# Patient Record
Sex: Male | Born: 1950 | Race: White | Hispanic: No | Smoking: Former smoker
Health system: Southern US, Community
[De-identification: ages and names within clinical notes are randomized; demographics above are authoritative.]

## PROBLEM LIST (undated history)

## (undated) DIAGNOSIS — I10 Essential (primary) hypertension: Secondary | ICD-10-CM

## (undated) DIAGNOSIS — J302 Other seasonal allergic rhinitis: Secondary | ICD-10-CM

## (undated) DIAGNOSIS — L0390 Cellulitis, unspecified: Secondary | ICD-10-CM

## (undated) DIAGNOSIS — E7800 Pure hypercholesterolemia, unspecified: Secondary | ICD-10-CM

## (undated) DIAGNOSIS — G4730 Sleep apnea, unspecified: Secondary | ICD-10-CM

## (undated) HISTORY — DX: Other seasonal allergic rhinitis: J30.2

## (undated) HISTORY — DX: Sleep apnea, unspecified: G47.30

## (undated) HISTORY — DX: Pure hypercholesterolemia, unspecified: E78.00

## (undated) HISTORY — PX: OTHER SURGICAL HISTORY: SHX169

## (undated) HISTORY — DX: Essential (primary) hypertension: I10

## (undated) HISTORY — PX: CATARACT EXTRACTION: SUR2

---

## 1989-08-22 ENCOUNTER — Encounter: Payer: Self-pay | Admitting: Internal Medicine

## 1989-09-13 ENCOUNTER — Encounter: Payer: Self-pay | Admitting: Internal Medicine

## 2006-03-16 ENCOUNTER — Ambulatory Visit: Payer: Self-pay | Admitting: Internal Medicine

## 2007-03-15 DIAGNOSIS — E119 Type 2 diabetes mellitus without complications: Secondary | ICD-10-CM

## 2007-03-15 DIAGNOSIS — G4733 Obstructive sleep apnea (adult) (pediatric): Secondary | ICD-10-CM

## 2007-03-15 DIAGNOSIS — E7800 Pure hypercholesterolemia, unspecified: Secondary | ICD-10-CM

## 2007-03-15 DIAGNOSIS — J301 Allergic rhinitis due to pollen: Secondary | ICD-10-CM

## 2007-03-15 DIAGNOSIS — IMO0002 Reserved for concepts with insufficient information to code with codable children: Secondary | ICD-10-CM | POA: Insufficient documentation

## 2007-03-17 ENCOUNTER — Encounter: Payer: Self-pay | Admitting: Internal Medicine

## 2008-05-01 ENCOUNTER — Ambulatory Visit: Payer: Self-pay | Admitting: Internal Medicine

## 2009-07-21 ENCOUNTER — Ambulatory Visit: Payer: Self-pay | Admitting: Internal Medicine

## 2010-03-12 NOTE — Assessment & Plan Note (Signed)
Summary: rov/apc   Primary Provider/Referring Provider:  Gerri Spore  CC:  Follow up visit-sleep apnea.  History of Present Illness: He comes now to establish here for long-term followup.  He is using a nasal mask through [employer].  He gets occasional mild nasal congestion and treats himself with nasal saline spray and Breathe Right nasal strips, which work well.  He does drink some coffee, but does not push caffeine.  He has been able to lose about 20 pounds in the last couple of years.   May 14, 2008- OSA, allergic rhinitis Seasonal use now of Neti pot, Zyrtec. Continues cpap, good compliance at 10 cwp. 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 does drink occasional caffeine. Using nasal mask, [facility]. He smoked over 30 years. Dr Gerri Spore follows cxr.  July 21, 2009 OSA. allergic rhintis  He asks about changing DME company for sleep apnea  for problems with service. CPAP at 10 remains comfortable and he never sleeps without it. Current machine is getting old.   Hypertension History:      Positive major cardiovascular risk factors include male age 18 years old or older, diabetes, and hyperlipidemia.  Negative major cardiovascular risk factors include non-tobacco-user status.     Preventive Screening-Counseling & Management  Alcohol-Tobacco     Smoking Status: quit > 6 months  Current Medications (verified): 1)  Cpap 10 [facility] 2)  Metformin Hcl 1000 Mg  Tabs (Metformin Hcl) .... Take 1 Tablet By Mouth Once A Day 3)  Lisinopril 20 Mg  Tabs (Lisinopril) .... 1/2 Tab Daily 4)  Simvastatin 40 Mg  Tabs (Simvastatin) .... Take 1 Tablet By Mouth Once A Day 5)  Januvia 50 Mg Tabs (Sitagliptin Phosphate) .... Take 1 By Mouth Once Daily 6)  Glyburide 2.5 Mg Tabs (Glyburide) .... Take 1 By Mouth Once Daily 7)  Avodart 0.5 Mg Caps (Dutasteride) .... Take 1 By Mouth Once Daily 8)  Fluticasone Propionate 50 Mcg/act Susp (Fluticasone Propionate)  .Marland Kitchen.. 1-2 Sprays in Each Nostril Once Daily As Needed 9)  Zyrtec Hives Relief 10 Mg Tabs (Cetirizine Hcl) .... Take 1 By Mouth Once Daily  Allergies (verified): No Known Drug Allergies  Past History:  Past Surgical History: Last updated: May 14, 2008 Repair septal deviation  Family History: Last updated: 05/14/2008 Father died- cancer  Social History: Last updated: 07/21/2009 Patient states former smoker.  Married Naval architect- long haul  Risk Factors: Smoking Status: quit > 6 months (07/21/2009)  Past Medical History: SLEEP APNEA (ICD-780.57)                         > NPSG 08/22/89  RDI 33/hr HYPERCHOLESTEROLEMIA (ICD-272.0) ALLERGIC RHINITIS, SEASONAL (ICD-477.0) DIABETES MELLITUS (ICD-250.00) HYPERTENSION NEC (ICD-997.91)  Social History: Patient states former smoker.  Married Naval architect- long haul Smoking Status:  quit > 6 months  Review of Systems      See HPI  The patient denies shortness of breath with activity, shortness of breath at rest, productive cough, non-productive cough, coughing up blood, chest pain, irregular heartbeats, acid heartburn, indigestion, loss of appetite, weight change, abdominal pain, difficulty swallowing, sore throat, tooth/dental problems, headaches, nasal congestion/difficulty breathing through nose, and sneezing.    Vital Signs:  Patient profile:   60 year old male Height:      73 inches Weight:      225 pounds BMI:     29.79 O2 Sat:      95 % on Room  air Pulse rate:   85 / minute BP sitting:   134 / 80  (left arm) Cuff size:   regular  Vitals Entered By: Reynaldo Minium CMA (July 21, 2009 2:37 PM)  O2 Flow:  Room air  Physical Exam  Additional Exam:  General: A/Ox3; pleasant and cooperative, NAD, alert SKIN: pimple on bridge of nose he says is incidental, not affected by his CPAP mask NODES: no lymphadenopathy HEENT: [place]/AT, EOM- WNL, Conjuctivae- clear, PERRLA, TM-WNL, Nose- clear, Throat- clear and wnl, Mallampati  II-III NECK: Supple w/ fair ROM, JVD- none, normal carotid impulses w/o bruits Thyroid-  CHEST: Clear to P&A, diminished HEART: RRR, no m/g/r heard ABDOMEN:  EAV:WUJW, nl pulses, no edema  NEURO: Grossly intact to observation      Impression & Recommendations:  Problem # 1:  SLEEP APNEA (ICD-780.57)  We will get old chart and also record from [facility]. His old machine has failed. We will replace it and let him talk to [facility] aboput changing DME companies. Original sleep study records found and reviewed.  Problem # 2:  ALLERGIC RHINITIS, SEASONAL (ICD-477.0) Discussed. he doesn't recognize much nasal congestion now, or interference with CPAP.  Medications Added to Medication List This Visit: 1)  Januvia 50 Mg Tabs (Sitagliptin phosphate) .... Take 1 by mouth once daily 2)  Glyburide 2.5 Mg Tabs (Glyburide) .... Take 1 by mouth once daily 3)  Avodart 0.5 Mg Caps (Dutasteride) .... Take 1 by mouth once daily 4)  Fluticasone Propionate 50 Mcg/act Susp (Fluticasone propionate) .Marland Kitchen.. 1-2 sprays in each nostril once daily as needed 5)  Zyrtec Hives Relief 10 Mg Tabs (Cetirizine hcl) .... Take 1 by mouth once daily  Other Orders: Est. Patient Level III (11914) DME Referral (DME)  Hypertension Assessment/Plan:      The patient's hypertensive risk group is category C: Target organ damage and/or diabetes.  Today's blood pressure is 134/80.    Patient Instructions: 1)  Please schedule a follow-up appointment in 1 year. 2)  See [facility] to replace CPAP machine and also talk with her about changing home care companies.

## 2010-06-26 NOTE — Assessment & Plan Note (Signed)
[facility]                             PULMONARY OFFICE NOTE   NAME:Adam Cardenas, Adam Cardenas                        MRN:          [id number]  DATE:03/16/2006                            DOB:          12-Aug-1950    PULMONARY/SLEEP MEDICINE FOLLOWUP:  Obstructive sleep apnea.   HISTORY:  This gentleman was diagnosed with obstructive sleep apnea  years ago through [facility].  I do  not find the original report, but he has used C-PAP successfully for  many years at 10 CWP and uses it all night every night.  His wife does  not tell him he snores through it.  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  He  comes now to establish here for long-term followup.  He is using a nasal  mask through [employer].  He gets occasional mild nasal  congestion and treats himself with nasal saline spray and Breathe Right  nasal strips, which work well.  He does drink some coffee, but does not  push caffeine.  He has been able to lose about 20 pounds in the last  couple of years.   MEDICATIONS:  1. C-PAP at 10 CWP.  2. Metformin at 1000 mg.  3. Lisinopril 20 mg times one half.  4. Simvastatin 40 mg.   ALLERGIES:  No medication allergy.   REVIEW OF SYSTEMS:  No headaches, syncope, confusion, sleep disordered  movement or acute events.  Weight down, as noted.   PAST HISTORY:  Septoplasty around 21.  Hypertension, diabetes,  elevated cholesterol, sleep apnea.  No significant seasonal allergy  complaints.   SOCIAL HISTORY:  He quit smoking in 2001, after averaging a pack a day  for 35 years.  Married, working as a Naval architect.   FAMILY HISTORY:  Father had metastatic cancer.  Others in the family  with heart disease.   OBJECTIVE:  Weight 201 pounds, compared with 203 pounds in 1991.  BP  124/68, pulse regular at 65, room air saturation 96%.  This is a tall  man.  He  is not overweight.  Nasal airway is clear.  Palate spacing 2/4,  voice quality is normal, mandible normal, pharyngeal spacing 2/4 with no  stridor.  Lungs are clear.  No neck vein distention or thyromegaly.  Heart sounds regular, without murmur.  No restlessness or tremor.   IMPRESSION:  Stable control of obstructive sleep apnea.  Irregular sleep  schedule because of his job.   PLAN:  1. We discussed the physiology and available treatments for sleep      apnea, emphasizing his responsibility to be a safe and alert      driver.  2. Continue C-PAP with 10 CWP through [facility].  3. Schedule return in one year, earlier p.r.n.     Clinton D. Maple Hudson, MD, Tonny Bollman, FACP  Electronically Signed    CDY/MedQ  DD: 03/16/2006  DT: 03/16/2006  Job #: [id number]   cc:   Otilio Connors. Gerri Spore, M.D.

## 2011-01-01 ENCOUNTER — Encounter: Payer: Self-pay | Admitting: Internal Medicine

## 2011-01-04 ENCOUNTER — Ambulatory Visit: Payer: Self-pay | Admitting: Internal Medicine

## 2012-05-29 ENCOUNTER — Encounter: Payer: Self-pay | Admitting: Internal Medicine

## 2012-05-29 ENCOUNTER — Ambulatory Visit (INDEPENDENT_AMBULATORY_CARE_PROVIDER_SITE_OTHER): Payer: BC Managed Care – PPO | Admitting: Internal Medicine

## 2012-05-29 ENCOUNTER — Ambulatory Visit (INDEPENDENT_AMBULATORY_CARE_PROVIDER_SITE_OTHER)
Admission: RE | Admit: 2012-05-29 | Discharge: 2012-05-29 | Disposition: A | Discharge status: Home / Self Care | Payer: BC Managed Care – PPO | Source: Ambulatory Visit | Attending: Internal Medicine | Admitting: Internal Medicine

## 2012-05-29 VITALS — BP 120/78 | HR 72 | Ht 73.0 in | Wt 218.4 lb

## 2012-05-29 DIAGNOSIS — J309 Allergic rhinitis, unspecified: Secondary | ICD-10-CM

## 2012-05-29 DIAGNOSIS — Z87891 Personal history of nicotine dependence: Secondary | ICD-10-CM

## 2012-05-29 DIAGNOSIS — G4733 Obstructive sleep apnea (adult) (pediatric): Secondary | ICD-10-CM

## 2012-05-29 DIAGNOSIS — J302 Other seasonal allergic rhinitis: Secondary | ICD-10-CM

## 2012-05-29 DIAGNOSIS — J301 Allergic rhinitis due to pollen: Secondary | ICD-10-CM

## 2012-05-29 NOTE — Patient Instructions (Addendum)
We can continue CPAP 10/ [facility]. Please call as needed  Order-- CXR   Dx hx tobacco use

## 2012-05-29 NOTE — Progress Notes (Signed)
05/29/12- 68 yoM former smoker followed for OSA  FOLLOWS NWG:NFAOZH visit to show compliance for insurance as being followed for OSA; Wears CPAP every night for about 7 hours(never takes off during sleep). LOV 07/21/09. 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 CPAP 10/[facility] with nasal mask. He prefers not to use humidifier.no changes needed. Seasonal allergic rhinitis with occasional stuffiness. He rinses with saline and uses Flonase. Not coughing.  ROS-see HPI Constitutional:   No-   weight loss, night sweats, fevers, chills, fatigue, lassitude. HEENT:   No-  headaches, difficulty swallowing, tooth/dental problems, sore throat,       No-  sneezing, itching, ear ache, nasal congestion, post nasal drip,  CV:  No-   chest pain, orthopnea, PND, swelling in lower extremities, anasarca,                                  dizziness, palpitations Resp: No-   shortness of breath with exertion or at rest.              No-   productive cough,  No non-productive cough,  No- coughing up of blood.              No-   change in color of mucus.  No- wheezing.   Skin: No-   rash or lesions. GI:  No-   heartburn, indigestion, abdominal pain, nausea, vomiting, diarrhea,                 change in bowel habits, loss of appetite GU: No-   dysuria, change in color of urine, no urgency or frequency.  No- flank pain. MS:  No-   joint pain or swelling.  No- decreased range of motion.  No- back pain. Neuro-     nothing unusual Psych:  No- change in mood or affect. No depression or anxiety.  No memory loss.  OBJ- Physical Exam General- Alert, Oriented, Affect-appropriate, Distress- none acute. Medium build Skin- rash-none, lesions- none, excoriation- none Lymphadenopathy- none Head- atraumatic            Eyes- Gross vision intact, PERRLA, conjunctivae and secretions clear            Ears- Hearing, canals-normal            Nose- Clear, no-Septal dev, mucus, polyps, erosion, perforation             Throat-  Mallampati III , mucosa clear , drainage- none, tonsils- atrophic Neck- flexible , trachea midline, no stridor , thyroid nl, carotid no bruit Chest - symmetrical excursion , unlabored           Heart/CV- RRR , no murmur , no gallop  , no rub, nl s1 s2                           - JVD- none , edema- none, stasis changes- none, varices- none           Lung- clear to P&A, wheeze- none, cough- none , dullness-none, rub- none           Chest wall-  Abd-  Br/ Gen/ Rectal- Not done, not indicated Extrem- cyanosis- none, clubbing, none, atrophy- none, strength- nl Neuro- grossly intact to observation

## 2012-06-02 ENCOUNTER — Encounter: Payer: Self-pay | Admitting: *Deleted

## 2012-06-02 NOTE — Progress Notes (Signed)
Quick Note:  ATC patient at [facility] number-wrong number-no other numbers listed for patient . Will send letter to patient to have him call and get CXR results as well as give [place] a good, working phone number to contact him on. ______

## 2012-06-04 DIAGNOSIS — J302 Other seasonal allergic rhinitis: Secondary | ICD-10-CM | POA: Insufficient documentation

## 2012-06-04 NOTE — Assessment & Plan Note (Addendum)
Good compliance and control. He is doing very well with this. Former smoker so I am checking chest x-ray for documentation.

## 2012-06-04 NOTE — Assessment & Plan Note (Signed)
Adequate control with Flonase, occasional antihistamine and saline nasal rinse.

## 2012-06-12 ENCOUNTER — Telehealth: Payer: Self-pay | Admitting: Internal Medicine

## 2012-06-12 NOTE — Telephone Encounter (Signed)
Notes Recorded by Waymon Budge, MD on 05/29/2012 at 3:19 PM CXR- scar or crowded markings in left base. No active process seen  I spoke with patient about results and he verbalized understanding and had no questions

## 2013-05-28 ENCOUNTER — Ambulatory Visit: Payer: BC Managed Care – PPO | Admitting: Internal Medicine

## 2013-07-27 ENCOUNTER — Other Ambulatory Visit (HOSPITAL_COMMUNITY): Payer: Self-pay | Admitting: Family Medicine

## 2013-07-27 DIAGNOSIS — R634 Abnormal weight loss: Secondary | ICD-10-CM

## 2013-07-27 DIAGNOSIS — R109 Unspecified abdominal pain: Secondary | ICD-10-CM

## 2013-07-31 ENCOUNTER — Ambulatory Visit (HOSPITAL_COMMUNITY)
Admission: RE | Admit: 2013-07-31 | Discharge: 2013-07-31 | Disposition: A | Discharge status: Home / Self Care | Payer: BC Managed Care – PPO | Source: Ambulatory Visit | Attending: Family Medicine | Admitting: Family Medicine

## 2013-07-31 ENCOUNTER — Other Ambulatory Visit (HOSPITAL_COMMUNITY): Payer: Self-pay | Admitting: Family Medicine

## 2013-07-31 DIAGNOSIS — R634 Abnormal weight loss: Secondary | ICD-10-CM

## 2013-07-31 DIAGNOSIS — R109 Unspecified abdominal pain: Secondary | ICD-10-CM | POA: Insufficient documentation

## 2013-07-31 MED ORDER — IOHEXOL 300 MG/ML  SOLN
100.0000 mL | Freq: Once | INTRAMUSCULAR | Status: AC | PRN
  Administered 2013-07-31: 100 mL via INTRAVENOUS

## 2014-03-11 ENCOUNTER — Encounter: Payer: Self-pay | Admitting: Endocrinology

## 2015-06-23 ENCOUNTER — Encounter: Payer: Self-pay | Admitting: Endocrinology

## 2016-07-12 ENCOUNTER — Encounter: Payer: Self-pay | Admitting: Endocrinology

## 2017-02-21 ENCOUNTER — Encounter: Payer: Self-pay | Admitting: Endocrinology

## 2017-02-21 ENCOUNTER — Ambulatory Visit: Payer: BLUE CROSS/BLUE SHIELD | Admitting: Endocrinology

## 2017-02-21 VITALS — BP 157/66 | HR 76 | Wt 221.4 lb

## 2017-02-21 DIAGNOSIS — E1129 Type 2 diabetes mellitus with other diabetic kidney complication: Secondary | ICD-10-CM | POA: Diagnosis not present

## 2017-02-21 DIAGNOSIS — R809 Proteinuria, unspecified: Secondary | ICD-10-CM

## 2017-02-21 MED ORDER — BROMOCRIPTINE MESYLATE 2.5 MG PO TABS: 1.2500 mg | ORAL_TABLET | Freq: Every day | ORAL | 3 refills | Status: DC

## 2017-02-21 MED ORDER — PIOGLITAZONE HCL 15 MG PO TABS: 15.0000 mg | ORAL_TABLET | Freq: Every day | ORAL | 3 refills | Status: DC

## 2017-02-21 NOTE — Progress Notes (Signed)
Subjective:    Patient ID: Adam Cardenas, male    DOB: September 10, 1950, 67 y.o.   MRN: [id number]  HPI pt is referred by Carilyn GoodpastureJennifer Willard, PA, for diabetes.  Pt states DM was dx'ed in 2005; he has mild neuropathy of the lower extremities; he has associated nephropathy; he has never been on insulin; pt says his diet and exercise are poor: he has never had pancreatic surgery, severe hypoglycemia or DKA.  He is a IT trainertrucker.  He had pancreatitis in 2015, possibly due to [place].  He takes 4 oral meds.   Past Medical History:  Diagnosis Date  . Diabetes mellitus   . HTN (hypertension)   . Hypercholesterolemia   . Seasonal allergic rhinitis   . Sleep apnea     Past Surgical History:  Procedure Laterality Date  . repair deviated septum      Social History   Socioeconomic History  . Marital status: Unknown    Spouse name: Not on file  . Number of children: Not on file  . Years of education: Not on file  . Highest education level: Not on file  Social Needs  . Financial resource strain: Not on file  . Food insecurity - worry: Not on file  . Food insecurity - inability: Not on file  . Transportation needs - medical: Not on file  . Transportation needs - non-medical: Not on file  Occupational History  . Occupation: truck Hospital doctordriver - long haul  Tobacco Use  . Smoking status: Former Smoker    Packs/day: 1.00    Years: 30.00    Pack years: 30.00    Types: Cigarettes    Last attempt to quit: 02/08/1998    Years since quitting: 19.0  Substance and Sexual Activity  . Alcohol use: Yes    Comment: rare-liquor  . Drug use: No  . Sexual activity: Not on file  Other Topics Concern  . Not on file  Social History Narrative  . Not on file    Current Outpatient Medications on File Prior to Visit  Medication Sig Dispense Refill  . aspirin 81 MG tablet Take 81 mg by mouth daily.    . canagliflozin (INVOKANA) 100 MG TABS tablet Take 100 mg by mouth daily before breakfast.    . dutasteride  (AVODART) 0.5 MG capsule Take 0.5 mg by mouth daily.      Marland Kitchen. glucosamine-chondroitin 500-400 MG tablet Take 1 tablet by mouth 2 (two) times daily.    Marland Kitchen. glyBURIDE (DIABETA) 2.5 MG tablet Take 2.5 mg by mouth daily with breakfast.      . lisinopril (PRINIVIL,ZESTRIL) 20 MG tablet Take 1/2 tab by mouth daily     . metFORMIN (GLUCOPHAGE) 1000 MG tablet Take 1,000 mg by mouth daily with breakfast.      . Multiple Vitamins-Minerals (ONE-A-DAY 50 PLUS) TABS Take 1 tablet by mouth daily.    . simvastatin (ZOCOR) 40 MG tablet Take 40 mg by mouth at bedtime.      . cetirizine (ZYRTEC) 10 MG tablet Take 10 mg by mouth daily.      . fluticasone (FLONASE) 50 MCG/ACT nasal spray Place 1-2 sprays into the nose daily as needed.       No current facility-administered medications on file prior to visit.     No Known Allergies  Family History  Problem Relation Age of Onset  . Cancer Father   . Diabetes Maternal Grandfather      Review of Systems denies weight loss, blurry  vision, headache, chest pain, sob, n/v, excessive diaphoresis, memory loss, depression, cold intolerance, and easy bruising.  He has chronic rhinorrhea, urinary frequency, and leg cramps.     Objective:   Physical Exam VS: see vs page GEN: no distress HEAD: head: no deformity eyes: no periorbital swelling, no proptosis external nose and ears are normal mouth: no lesion seen NECK: supple, thyroid is not enlarged CHEST WALL: no deformity LUNGS: clear to auscultation CV: reg rate and rhythm, no murmur ABD: abdomen is soft, nontender.  no hepatosplenomegaly.  not distended.  no hernia MUSCULOSKELETAL: muscle bulk and strength are grossly normal.  no obvious joint swelling.  gait is normal and steady EXTEMITIES: no deformity.  no ulcer on the feet.  feet are of normal color and temp.  1+ bilat leg edema.  There is bilateral onychomycosis of the toenails.  PULSES: dorsalis pedis intact bilat.  no carotid bruit NEURO:  cn 2-12 grossly  intact.   readily moves all 4's.  sensation is intact to touch on the feet, but decreased from normal SKIN:  Normal texture and temperature.  No rash or suspicious lesion is visible.   NODES:  None palpable at the neck PSYCH: alert, well-oriented.  Does not appear anxious nor depressed.  outside test results are reviewed: A1c=9.4%  I have reviewed outside records, and summarized: Pt was noted to have elevated a1c, and referred here. Main problem addressed was joint pain, but it was also noted that DM was not well-controlled     Assessment & Plan:  Type 2 DM, with polyneuropathy: he needs increased rx Occupational status: he needs to control DM without insulin.  Edema: prob caused or exac by pioglitizone.  However, a1c is too high to d/c this now H/o pancreatitis: this limits rx options Obesity: new to me: he declines surgery and ref specialist.   Patient Instructions  good diet and exercise significantly improve the control of your diabetes.  please let me know if you wish to be referred to a dietician.  high blood sugar is very risky to your health.  you should see an eye doctor and dentist every year.  It is very important to get all recommended vaccinations.  Controlling your blood pressure and cholesterol drastically reduces the damage diabetes does to your body.  Those who smoke should quit.  Please discuss these with your doctor.  check your blood sugar once a day.  vary the time of day when you check, between before the 3 meals, and at bedtime.  also check if you have symptoms of your blood sugar being too high or too low.  please keep a record of the readings and bring it to your next appointment here (or you can bring the meter itself).  You can write it on any piece of paper.  please call us sooner if your blood sugar goes below 70, or if you have a lot of readings over 200.  I have sent a prescription to your pharmacy, to add "bromocriptine," to help your blood sugar. It has  possible side effects of nausea and dizziness.  These go away with time.  You can avoid these by taking it at bedtime, and by taking just take 1/4 pill for the first week.   Please call or message in 2 weeks, to tell us how the blood sugar is doing.  If necessary, we can add "acarbose," or "welchol."   Please come back for a follow-up appointment in 2 months.

## 2017-02-21 NOTE — Patient Instructions (Addendum)
good diet and exercise significantly improve the control of your diabetes.  please let me know if you wish to be referred to a dietician.  high blood sugar is very risky to your health.  you should see an eye doctor and dentist every year.  It is very important to get all recommended vaccinations.  Controlling your blood pressure and cholesterol drastically reduces the damage diabetes does to your body.  Those who smoke should quit.  Please discuss these with your doctor.  check your blood sugar once a day.  vary the time of day when you check, between before the 3 meals, and at bedtime.  also check if you have symptoms of your blood sugar being too high or too low.  please keep a record of the readings and bring it to your next appointment here (or you can bring the meter itself).  You can write it on any piece of paper.  please call us sooner if your blood sugar goes below 70, or if you have a lot of readings over 200.  I have sent a prescription to your pharmacy, to add "bromocriptine," to help your blood sugar. It has possible side effects of nausea and dizziness.  These go away with time.  You can avoid these by taking it at bedtime, and by taking just take 1/4 pill for the first week.   Please call or message in 2 weeks, to tell us how the blood sugar is doing.  If necessary, we can add "acarbose," or "welchol."   Please come back for a follow-up appointment in 2 months.

## 2017-04-18 ENCOUNTER — Ambulatory Visit: Payer: BLUE CROSS/BLUE SHIELD | Admitting: Endocrinology

## 2017-04-18 ENCOUNTER — Encounter: Payer: Self-pay | Admitting: Endocrinology

## 2017-04-18 VITALS — BP 158/70 | HR 74 | Wt 227.0 lb

## 2017-04-18 DIAGNOSIS — R809 Proteinuria, unspecified: Secondary | ICD-10-CM

## 2017-04-18 DIAGNOSIS — E1129 Type 2 diabetes mellitus with other diabetic kidney complication: Secondary | ICD-10-CM | POA: Diagnosis not present

## 2017-04-18 LAB — BASIC METABOLIC PANEL
BUN: 25 mg/dL — ABNORMAL HIGH (ref 6–23)
CALCIUM: 10.1 mg/dL (ref 8.4–10.5)
CO2: 32 mEq/L (ref 19–32)
Chloride: 97 mEq/L (ref 96–112)
Creatinine, Ser: 0.98 mg/dL (ref 0.40–1.50)
GFR: 80.81 mL/min (ref >60.00)
Glucose, Bld: 180 mg/dL — ABNORMAL HIGH (ref 70–99)
Potassium: 3.8 mEq/L (ref 3.5–5.1)
Sodium: 137 mEq/L (ref 135–145)

## 2017-04-18 LAB — POCT GLYCOSYLATED HEMOGLOBIN (HGB A1C): Hemoglobin A1C: 8.9

## 2017-04-18 MED ORDER — GLYBURIDE 5 MG PO TABS: 5.0000 mg | ORAL_TABLET | Freq: Every day | ORAL | 3 refills | Status: DC

## 2017-04-18 MED ORDER — METFORMIN HCL ER 500 MG PO TB24: 2000.0000 mg | ORAL_TABLET | Freq: Every day | ORAL | 3 refills | Status: DC

## 2017-04-18 NOTE — Patient Instructions (Addendum)
Your blood pressure is high today.  Please see your primary care provider soon, to have it rechecked blood tests are requested for you today.  We'll let you know about the results.  I have sent a prescription to your pharmacy, to double the metformin and glyburide.  Please continue the same other medications check your blood sugar once a day.  vary the time of day when you check, between before the 3 meals, and at bedtime.  also check if you have symptoms of your blood sugar being too high or too low.  please keep a record of the readings and bring it to your next appointment here (or you can bring the meter itself).  You can write it on any piece of paper.  please call us sooner if your blood sugar goes below 70, or if you have a lot of readings over 200.  Please come back for a follow-up appointment in 2 months.

## 2017-04-18 NOTE — Progress Notes (Signed)
Subjective:    Patient ID: Adam Cardenas, male    DOB: September 10, 1950, 67 y.o.   MRN: [id number]  HPI Pt returns for f/u of diabetes mellitus: DM type: 2 Dx'ed: 2005 Complications: neuropathy of the lower extremities; he has associated nephropathy Therapy: 5 oral meds DKA: never Severe hypoglycemia: never Pancreatitis: once, in 2015, possibly due to [place].   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  Interval history: he takes meds as rx'ed.  He brings a record of his cbg's which I have reviewed today.  All are checked fasting.  It varies from 170-200's.  pt states he feels well in general.  Past Medical History:  Diagnosis Date  . Diabetes mellitus   . HTN (hypertension)   . Hypercholesterolemia   . Seasonal allergic rhinitis   . Sleep apnea     Past Surgical History:  Procedure Laterality Date  . repair deviated septum      Social History   Socioeconomic History  . Marital status: Unknown    Spouse name: Not on file  . Number of children: Not on file  . Years of education: Not on file  . Highest education level: Not on file  Social Needs  . Financial resource strain: Not on file  . Food insecurity - worry: Not on file  . Food insecurity - inability: Not on file  . Transportation needs - medical: Not on file  . Transportation needs - non-medical: Not on file  Occupational History  . Occupation: truck Hospital doctordriver - long haul  Tobacco Use  . Smoking status: Former Smoker    Packs/day: 1.00    Years: 30.00    Pack years: 30.00    Types: Cigarettes    Last attempt to quit: 02/08/1998    Years since quitting: 19.2  Substance and Sexual Activity  . Alcohol use: Yes    Comment: rare-liquor  . Drug use: No  . Sexual activity: Not on file  Other Topics Concern  . Not on file  Social History Narrative  . Not on file    Current Outpatient  Medications on File Prior to Visit  Medication Sig Dispense Refill  . aspirin 81 MG tablet Take 81 mg by mouth daily.    . bromocriptine (PARLODEL) 2.5 MG tablet Take 0.5 tablets (1.25 mg total) by mouth daily. 45 tablet 3  . canagliflozin (INVOKANA) 100 MG TABS tablet Take 100 mg by mouth daily before breakfast.    . cetirizine (ZYRTEC) 10 MG tablet Take 10 mg by mouth daily.      Marland Kitchen. dutasteride (AVODART) 0.5 MG capsule Take 0.5 mg by mouth daily.      . fluticasone (FLONASE) 50 MCG/ACT nasal spray Place 1-2 sprays into the nose daily as needed.      Marland Kitchen. glucosamine-chondroitin 500-400 MG tablet Take 1 tablet by mouth 2 (two) times daily.    Marland Kitchen. lisinopril (PRINIVIL,ZESTRIL) 20 MG tablet Take 1/2 tab by mouth daily     . Multiple Vitamins-Minerals (ONE-A-DAY 50 PLUS) TABS Take 1 tablet by mouth daily.    . pioglitazone (ACTOS) 15 MG tablet Take 1 tablet (15 mg total) by mouth daily. 90 tablet 3  . simvastatin (ZOCOR) 40 MG tablet Take 40 mg by mouth at bedtime.       No current facility-administered medications on file prior to visit.  No Known Allergies  Family History  Problem Relation Age of Onset  . Cancer Father   . Diabetes Maternal Grandfather     BP (!) 158/70 (BP Location: Left Arm, Patient Position: Sitting, Cuff Size: Normal)   Pulse 74   Wt 227 lb (103 kg)   SpO2 95%   BMI 29.95 kg/m    Review of Systems He denies hypoglycemia    Objective:   Physical Exam VITAL SIGNS:  See vs page GENERAL: no distress Pulses: dorsalis pedis intact bilat.   MSK: no deformity of the feet CV: trace bilat leg edema Skin:  no ulcer on the feet.  normal color and temp on the feet. Neuro: sensation is intact to touch on the feet, but decreased from normal Ext: There is bilateral onychomycosis of the toenails.    Lab Results  Component Value Date   HGBA1C 8.9 04/18/2017       Assessment & Plan:  Type 2 DM, with polyneuropathy: he needs increased rx. Edema: this limits  dosage of pioglitizone.  Occupational status: he needs glycemic control without insulin.   Patient Instructions  Your blood pressure is high today.  Please see your primary care provider soon, to have it rechecked blood tests are requested for you today.  We'll let you know about the results.  I have sent a prescription to your pharmacy, to double the metformin and glyburide.  Please continue the same other medications check your blood sugar once a day.  vary the time of day when you check, between before the 3 meals, and at bedtime.  also check if you have symptoms of your blood sugar being too high or too low.  please keep a record of the readings and bring it to your next appointment here (or you can bring the meter itself).  You can write it on any piece of paper.  please call us sooner if your blood sugar goes below 70, or if you have a lot of readings over 200.  Please come back for a follow-up appointment in 2 months.

## 2017-04-22 ENCOUNTER — Other Ambulatory Visit: Payer: Self-pay

## 2017-04-22 ENCOUNTER — Telehealth: Payer: Self-pay | Admitting: Endocrinology

## 2017-04-22 MED ORDER — METFORMIN HCL ER 500 MG PO TB24: 2000.0000 mg | ORAL_TABLET | Freq: Every day | ORAL | 3 refills | Status: DC

## 2017-04-22 NOTE — Telephone Encounter (Signed)
I have resent prescription to pharmacy for patient.

## 2017-04-22 NOTE — Telephone Encounter (Signed)
metFORMIN (GLUCOPHAGE-XR) 500 MG 24 hr tablet  Patient stated that pharmacy has not received this medication.   Can we resend this for patient? Thanks!    [id number] [address]- [place], [place] - [address]

## 2017-06-20 ENCOUNTER — Encounter: Payer: Self-pay | Admitting: Endocrinology

## 2017-06-20 ENCOUNTER — Ambulatory Visit: Payer: BLUE CROSS/BLUE SHIELD | Admitting: Endocrinology

## 2017-06-20 VITALS — BP 158/68 | HR 75 | Wt 227.0 lb

## 2017-06-20 DIAGNOSIS — E0800 Diabetes mellitus due to underlying condition with hyperosmolarity without nonketotic hyperglycemic-hyperosmolar coma (NKHHC): Secondary | ICD-10-CM | POA: Diagnosis not present

## 2017-06-20 LAB — POCT GLYCOSYLATED HEMOGLOBIN (HGB A1C): HEMOGLOBIN A1C: 8.8

## 2017-06-20 MED ORDER — EMPAGLIFLOZIN 25 MG PO TABS: 25.0000 mg | ORAL_TABLET | Freq: Every day | ORAL | 3 refills | Status: DC

## 2017-06-20 MED ORDER — BROMOCRIPTINE MESYLATE 2.5 MG PO TABS: 2.5000 mg | ORAL_TABLET | Freq: Every day | ORAL | 3 refills | Status: DC

## 2017-06-20 NOTE — Patient Instructions (Addendum)
Your blood pressure is high today.  Please see your primary care provider soon, to have it rechecked I have sent a prescription to your pharmacy, to double the bromocriptine, and to change invokana to jardiance, and: Please stop taking the pioglitizone.  Please continue the same other medications.  check your blood sugar once a day.  vary the time of day when you check, between before the 3 meals, and at bedtime.  also check if you have symptoms of your blood sugar being too high or too low.  please keep a record of the readings and bring it to your next appointment here (or you can bring the meter itself).  You can write it on any piece of paper.  please call us sooner if your blood sugar goes below 70, or if you have a lot of readings over 200.  Please come back for a follow-up appointment in 2 months.

## 2017-06-20 NOTE — Progress Notes (Signed)
Subjective:    Patient ID: Adam Cardenas, male    DOB: 25-Jan-1951, 67 y.o.   MRN: [id number]  HPI Pt returns for f/u of diabetes mellitus: DM type: 2 Dx'ed: 2005 Complications: polyneuropathy and nephropathy.  Therapy: 5 oral meds DKA: never Severe hypoglycemia: never Pancreatitis: once, in 2015, possibly due to [place].   Pancreatic imaging: well-defined ovoid 9 mm hypodensity over the distal body (2015 CT).   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  Interval history: he takes meds as rx'ed.  He brings a record of his cbg's which I have reviewed today.  All are checked fasting.  It varies from 151-235.  pt states he feels well in general.  He says ins has changed invokana to jardiance Past Medical History:  Diagnosis Date  . Diabetes mellitus   . HTN (hypertension)   . Hypercholesterolemia   . Seasonal allergic rhinitis   . Sleep apnea     Past Surgical History:  Procedure Laterality Date  . repair deviated septum      Social History   Socioeconomic History  . Marital status: Unknown    Spouse name: Not on file  . Number of children: Not on file  . Years of education: Not on file  . Highest education level: Not on file  Occupational History  . Occupation: truck Hospital doctor - long haul  Social Needs  . Financial resource strain: Not on file  . Food insecurity:    Worry: Not on file    Inability: Not on file  . Transportation needs:    Medical: Not on file    Non-medical: Not on file  Tobacco Use  . Smoking status: Former Smoker    Packs/day: 1.00    Years: 30.00    Pack years: 30.00    Types: Cigarettes    Last attempt to quit: 02/08/1998    Years since quitting: 19.3  Substance and Sexual Activity  . Alcohol use: Yes    Comment: rare-liquor  . Drug use: No  . Sexual activity: Not on file  Lifestyle  . Physical activity:    Days per week: Not on file    Minutes per session: Not on file  . Stress: Not on file    Relationships  . Social connections:    Talks on phone: Not on file    Gets together: Not on file    Attends religious service: Not on file    Active member of club or organization: Not on file    Attends meetings of clubs or organizations: Not on file    Relationship status: Not on file  . Intimate partner violence:    Fear of current or ex partner: Not on file    Emotionally abused: Not on file    Physically abused: Not on file    Forced sexual activity: Not on file  Other Topics Concern  . Not on file  Social History Narrative  . Not on file    Current Outpatient Medications on File Prior to Visit  Medication Sig Dispense Refill  . cetirizine (ZYRTEC) 10 MG tablet Take 10 mg by mouth daily.      Marland Kitchen dutasteride (AVODART) 0.5 MG capsule Take 0.5 mg by mouth daily.      Marland Kitchen glyBURIDE (DIABETA) 5 MG tablet Take 1 tablet (5 mg total) by mouth daily with breakfast. 90 tablet 3  . lisinopril (PRINIVIL,ZESTRIL) 20 MG tablet Take 1/2  tab by mouth daily     . metFORMIN (GLUCOPHAGE-XR) 500 MG 24 hr tablet Take 4 tablets (2,000 mg total) by mouth daily. 360 tablet 3  . simvastatin (ZOCOR) 40 MG tablet Take 40 mg by mouth at bedtime.      Marland Kitchen aspirin 81 MG tablet Take 81 mg by mouth daily.    . fluticasone (FLONASE) 50 MCG/ACT nasal spray Place 1-2 sprays into the nose daily as needed.      Marland Kitchen glucosamine-chondroitin 500-400 MG tablet Take 1 tablet by mouth 2 (two) times daily.    . Multiple Vitamins-Minerals (ONE-A-DAY 50 PLUS) TABS Take 1 tablet by mouth daily.     No current facility-administered medications on file prior to visit.     No Known Allergies  Family History  Problem Relation Age of Onset  . Cancer Father   . Diabetes Maternal Grandfather     BP (!) 158/68   Pulse 75   Wt 227 lb (103 kg)   SpO2 95%   BMI 29.95 kg/m    Review of Systems Denies sob    Objective:   Physical Exam VITAL SIGNS:  See vs page GENERAL: no distress Pulses: foot pulses are intact  bilaterally.   MSK: no deformity of the feet or ankles.  CV: 2+ bilat edema of the legs.   Skin:  no ulcer on the feet or ankles.  normal color and temp on the feet and ankles Neuro: sensation is intact to touch on the feet and ankles, but decreased from normal Ext: There is bilateral onychomycosis of the toenails.    A1c=8.3%   Lab Results  Component Value Date   CREATININE 0.98 04/18/2017   BUN 25 (H) 04/18/2017   NA 137 04/18/2017   K 3.8 04/18/2017   CL 97 04/18/2017   CO2 32 04/18/2017       Assessment & Plan:  Type 2 DM, with polyneuropathy: she needs increased rx Edema: worse.  Poss due to pioglitizone.   Patient Instructions  Your blood pressure is high today.  Please see your primary care provider soon, to have it rechecked I have sent a prescription to your pharmacy, to double the bromocriptine, and to change invokana to jardiance, and: Please stop taking the pioglitizone.  Please continue the same other medications.  check your blood sugar once a day.  vary the time of day when you check, between before the 3 meals, and at bedtime.  also check if you have symptoms of your blood sugar being too high or too low.  please keep a record of the readings and bring it to your next appointment here (or you can bring the meter itself).  You can write it on any piece of paper.  please call us sooner if your blood sugar goes below 70, or if you have a lot of readings over 200.  Please come back for a follow-up appointment in 2 months.

## 2017-09-05 ENCOUNTER — Ambulatory Visit: Payer: BLUE CROSS/BLUE SHIELD | Admitting: Endocrinology

## 2017-09-27 ENCOUNTER — Ambulatory Visit (INDEPENDENT_AMBULATORY_CARE_PROVIDER_SITE_OTHER): Payer: BLUE CROSS/BLUE SHIELD | Admitting: Endocrinology

## 2017-09-27 ENCOUNTER — Encounter: Payer: Self-pay | Admitting: Endocrinology

## 2017-09-27 VITALS — BP 162/78 | HR 86 | Ht 74.0 in | Wt 222.0 lb

## 2017-09-27 DIAGNOSIS — E0800 Diabetes mellitus due to underlying condition with hyperosmolarity without nonketotic hyperglycemic-hyperosmolar coma (NKHHC): Secondary | ICD-10-CM

## 2017-09-27 LAB — POCT GLYCOSYLATED HEMOGLOBIN (HGB A1C): Hemoglobin A1C: 8.5 % — AB (ref 4.0–5.6)

## 2017-09-27 LAB — GLUCOSE, POCT (MANUAL RESULT ENTRY): POC Glucose: 233 mg/dl — AB (ref 70–99)

## 2017-09-27 MED ORDER — GLYBURIDE 5 MG PO TABS: 5.0000 mg | ORAL_TABLET | Freq: Every day | ORAL | 3 refills | Status: DC

## 2017-09-27 MED ORDER — BROMOCRIPTINE MESYLATE 5 MG PO CAPS
5.0000 mg | ORAL_CAPSULE | Freq: Every day | ORAL | 3 refills | Status: DC
Start: 2017-09-27 — End: 2018-09-24

## 2017-09-27 NOTE — Patient Instructions (Signed)
Your blood pressure is high today.  Please see your primary care provider soon, to have it rechecked.   I have sent a prescription to your pharmacy, to double the bromocriptine.  Please continue the same other medications.  check your blood sugar once a day.  vary the time of day when you check, between before the 3 meals, and at bedtime.  also check if you have symptoms of your blood sugar being too high or too low.  please keep a record of the readings and bring it to your next appointment here (or you can bring the meter itself).  You can write it on any piece of paper.  please call us sooner if your blood sugar goes below 70, or if you have a lot of readings over 200.  Please come back for a follow-up appointment in 2 months.

## 2017-09-27 NOTE — Progress Notes (Signed)
Subjective:    Patient ID: Adam Cardenas, male    DOB: 1950/08/02, 67 y.o.   MRN: [id number]  HPI Pt returns for f/u of diabetes mellitus: DM type: 2 Dx'ed: 2005 Complications: polyneuropathy and nephropathy.  Therapy: 4 oral meds DKA: never Severe hypoglycemia: never Pancreatitis: once, in 2015, possibly due to [place].   Pancreatic imaging: well-defined ovoid 9 mm hypodensity over the distal body (2015 CT).   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  He brings a record of his cbg's which I have reviewed today.  All are checked fasting.  It varies from 167-300.  pt states he feels well in general.  Past Medical History:  Diagnosis Date  . Diabetes mellitus   . HTN (hypertension)   . Hypercholesterolemia   . Seasonal allergic rhinitis   . Sleep apnea     Past Surgical History:  Procedure Laterality Date  . repair deviated septum      Social History   Socioeconomic History  . Marital status: Unknown    Spouse name: Not on file  . Number of children: Not on file  . Years of education: Not on file  . Highest education level: Not on file  Occupational History  . Occupation: truck Hospital doctordriver - long haul  Social Needs  . Financial resource strain: Not on file  . Food insecurity:    Worry: Not on file    Inability: Not on file  . Transportation needs:    Medical: Not on file    Non-medical: Not on file  Tobacco Use  . Smoking status: Former Smoker    Packs/day: 1.00    Years: 30.00    Pack years: 30.00    Types: Cigarettes    Last attempt to quit: 02/08/1998    Years since quitting: 19.6  . Smokeless tobacco: Never Used  Substance and Sexual Activity  . Alcohol use: Yes    Comment: rare-liquor  . Drug use: No  . Sexual activity: Not on file  Lifestyle  . Physical activity:    Days per week: Not on file    Minutes per session: Not on file  .  Stress: Not on file  Relationships  . Social connections:    Talks on phone: Not on file    Gets together: Not on file    Attends religious service: Not on file    Active member of club or organization: Not on file    Attends meetings of clubs or organizations: Not on file    Relationship status: Not on file  . Intimate partner violence:    Fear of current or ex partner: Not on file    Emotionally abused: Not on file    Physically abused: Not on file    Forced sexual activity: Not on file  Other Topics Concern  . Not on file  Social History Narrative  . Not on file    Current Outpatient Medications on File Prior to Visit  Medication Sig Dispense Refill  . empagliflozin (JARDIANCE) 25 MG TABS tablet Take 25 mg by mouth daily. 90 tablet 3  . lisinopril (PRINIVIL,ZESTRIL) 20 MG tablet Take 1/2 tab by mouth daily     . metFORMIN (GLUCOPHAGE-XR) 500 MG 24 hr tablet Take 4 tablets (2,000 mg total) by mouth daily. 360 tablet 3  . simvastatin (ZOCOR) 40 MG tablet Take  40 mg by mouth at bedtime.      Marland Kitchen. aspirin 81 MG tablet Take 81 mg by mouth daily.    . cetirizine (ZYRTEC) 10 MG tablet Take 10 mg by mouth daily.      Marland Kitchen. dutasteride (AVODART) 0.5 MG capsule Take 0.5 mg by mouth daily.      . fluticasone (FLONASE) 50 MCG/ACT nasal spray Place 1-2 sprays into the nose daily as needed.      Marland Kitchen. glucosamine-chondroitin 500-400 MG tablet Take 1 tablet by mouth 2 (two) times daily.    . Multiple Vitamins-Minerals (ONE-A-DAY 50 PLUS) TABS Take 1 tablet by mouth daily.     No current facility-administered medications on file prior to visit.     No Known Allergies  Family History  Problem Relation Age of Onset  . Cancer Father   . Diabetes Maternal Grandfather     BP (!) 162/78 (BP Location: Left Arm, Patient Position: Sitting, Cuff Size: Normal)   Pulse 86   Ht 6\' 2"  (1.88 m)   Wt 222 lb (100.7 kg)   SpO2 96%   BMI 28.50 kg/m    Review of Systems He denies hypoglycemia      Objective:   Physical Exam VITAL SIGNS:  See vs page GENERAL: no distress Pulses: dorsalis pedis intact bilat.   MSK: no deformity of the feet CV: 1+ bilat leg edema Skin:  no ulcer on the feet.  normal color and temp on the feet. Neuro: sensation is intact to touch on the feet, but decreased from normal There is bilateral onychomycosis of the toenails.    Lab Results  Component Value Date   HGBA1C 8.5 (A) 09/27/2017       Assessment & Plan:  Type 2 DM: he needs increased rx. Occupational status: he need to control glucose without taking insulin. Edema: improved off pioglitazone..  Patient Instructions  Your blood pressure is high today.  Please see your primary care provider soon, to have it rechecked.   I have sent a prescription to your pharmacy, to double the bromocriptine.  Please continue the same other medications.  check your blood sugar once a day.  vary the time of day when you check, between before the 3 meals, and at bedtime.  also check if you have symptoms of your blood sugar being too high or too low.  please keep a record of the readings and bring it to your next appointment here (or you can bring the meter itself).  You can write it on any piece of paper.  please call us sooner if your blood sugar goes below 70, or if you have a lot of readings over 200.  Please come back for a follow-up appointment in 2 months.

## 2017-11-03 ENCOUNTER — Emergency Department (HOSPITAL_COMMUNITY): Payer: No Typology Code available for payment source

## 2017-11-03 ENCOUNTER — Encounter (HOSPITAL_COMMUNITY): Payer: Self-pay

## 2017-11-03 ENCOUNTER — Other Ambulatory Visit: Payer: Self-pay

## 2017-11-03 ENCOUNTER — Emergency Department (HOSPITAL_BASED_OUTPATIENT_CLINIC_OR_DEPARTMENT_OTHER)
Admit: 2017-11-03 | Discharge: 2017-11-03 | Disposition: A | Discharge status: Home / Self Care | Payer: No Typology Code available for payment source

## 2017-11-03 ENCOUNTER — Inpatient Hospital Stay (HOSPITAL_COMMUNITY)
Admission: EM | Admit: 2017-11-03 | Discharge: 2017-11-07 | DRG: 603 | Disposition: A | Discharge status: Home / Self Care | Payer: No Typology Code available for payment source | Attending: Internal Medicine | Admitting: Internal Medicine

## 2017-11-03 DIAGNOSIS — R609 Edema, unspecified: Secondary | ICD-10-CM | POA: Diagnosis not present

## 2017-11-03 DIAGNOSIS — W1789XA Other fall from one level to another, initial encounter: Secondary | ICD-10-CM | POA: Diagnosis present

## 2017-11-03 DIAGNOSIS — Z789 Other specified health status: Secondary | ICD-10-CM | POA: Diagnosis not present

## 2017-11-03 DIAGNOSIS — S8012XA Contusion of left lower leg, initial encounter: Secondary | ICD-10-CM | POA: Diagnosis present

## 2017-11-03 DIAGNOSIS — I10 Essential (primary) hypertension: Secondary | ICD-10-CM

## 2017-11-03 DIAGNOSIS — E119 Type 2 diabetes mellitus without complications: Secondary | ICD-10-CM | POA: Diagnosis not present

## 2017-11-03 DIAGNOSIS — Z87891 Personal history of nicotine dependence: Secondary | ICD-10-CM

## 2017-11-03 DIAGNOSIS — F1090 Alcohol use, unspecified, uncomplicated: Secondary | ICD-10-CM | POA: Diagnosis present

## 2017-11-03 DIAGNOSIS — N400 Enlarged prostate without lower urinary tract symptoms: Secondary | ICD-10-CM | POA: Diagnosis present

## 2017-11-03 DIAGNOSIS — J302 Other seasonal allergic rhinitis: Secondary | ICD-10-CM | POA: Diagnosis present

## 2017-11-03 DIAGNOSIS — Z7289 Other problems related to lifestyle: Secondary | ICD-10-CM

## 2017-11-03 DIAGNOSIS — E7800 Pure hypercholesterolemia, unspecified: Secondary | ICD-10-CM | POA: Diagnosis not present

## 2017-11-03 DIAGNOSIS — M7989 Other specified soft tissue disorders: Secondary | ICD-10-CM

## 2017-11-03 DIAGNOSIS — G4733 Obstructive sleep apnea (adult) (pediatric): Secondary | ICD-10-CM | POA: Diagnosis present

## 2017-11-03 DIAGNOSIS — K219 Gastro-esophageal reflux disease without esophagitis: Secondary | ICD-10-CM | POA: Diagnosis present

## 2017-11-03 DIAGNOSIS — M79609 Pain in unspecified limb: Secondary | ICD-10-CM

## 2017-11-03 DIAGNOSIS — Z7984 Long term (current) use of oral hypoglycemic drugs: Secondary | ICD-10-CM

## 2017-11-03 DIAGNOSIS — Z833 Family history of diabetes mellitus: Secondary | ICD-10-CM

## 2017-11-03 DIAGNOSIS — E785 Hyperlipidemia, unspecified: Secondary | ICD-10-CM | POA: Diagnosis present

## 2017-11-03 DIAGNOSIS — L0390 Cellulitis, unspecified: Secondary | ICD-10-CM | POA: Diagnosis present

## 2017-11-03 DIAGNOSIS — Z79899 Other long term (current) drug therapy: Secondary | ICD-10-CM

## 2017-11-03 DIAGNOSIS — L03116 Cellulitis of left lower limb: Principal | ICD-10-CM

## 2017-11-03 DIAGNOSIS — Z888 Allergy status to other drugs, medicaments and biological substances status: Secondary | ICD-10-CM

## 2017-11-03 HISTORY — DX: Cellulitis, unspecified: L03.90

## 2017-11-03 LAB — CBC WITH DIFFERENTIAL/PLATELET
ABS IMMATURE GRANULOCYTES: 0.1 10*3/uL (ref 0.0–0.1)
Basophils Absolute: 0.1 10*3/uL (ref 0.0–0.1)
Basophils Relative: 1 %
Eosinophils Absolute: 0.1 10*3/uL (ref 0.0–0.7)
Eosinophils Relative: 2 %
HCT: 46 % (ref 39.0–52.0)
HEMOGLOBIN: 15.1 g/dL (ref 13.0–17.0)
Immature Granulocytes: 1 %
LYMPHS PCT: 12 %
Lymphs Abs: 0.9 10*3/uL (ref 0.7–4.0)
MCH: 30.6 pg (ref 26.0–34.0)
MCHC: 32.8 g/dL (ref 30.0–36.0)
MCV: 93.1 fL (ref 78.0–100.0)
Monocytes Absolute: 0.8 10*3/uL (ref 0.1–1.0)
Monocytes Relative: 10 %
NEUTROS ABS: 5.4 10*3/uL (ref 1.7–7.7)
Neutrophils Relative %: 74 %
Platelets: 195 10*3/uL (ref 150–400)
RBC: 4.94 MIL/uL (ref 4.22–5.81)
RDW: 14.3 % (ref 11.5–15.5)
WBC: 7.4 10*3/uL (ref 4.0–10.5)

## 2017-11-03 LAB — I-STAT CG4 LACTIC ACID, ED: Lactic Acid, Venous: 1.75 mmol/L (ref 0.5–1.9)

## 2017-11-03 LAB — COMPREHENSIVE METABOLIC PANEL
ALK PHOS: 87 U/L (ref 38–126)
ALT: 43 U/L (ref 0–44)
ANION GAP: 14 (ref 5–15)
AST: 32 U/L (ref 15–41)
Albumin: 4.5 g/dL (ref 3.5–5.0)
BUN: 21 mg/dL (ref 8–23)
CO2: 26 mmol/L (ref 22–32)
Calcium: 10.2 mg/dL (ref 8.9–10.3)
Chloride: 97 mmol/L — ABNORMAL LOW (ref 98–111)
Creatinine, Ser: 1.11 mg/dL (ref 0.61–1.24)
GFR calc Af Amer: >60 mL/min (ref >60)
GFR calc non Af Amer: >60 mL/min (ref >60)
Glucose, Bld: 306 mg/dL — ABNORMAL HIGH (ref 70–99)
Potassium: 4.1 mmol/L (ref 3.5–5.1)
Sodium: 137 mmol/L (ref 135–145)
Total Bilirubin: 0.9 mg/dL (ref 0.3–1.2)
Total Protein: 7.5 g/dL (ref 6.5–8.1)

## 2017-11-03 LAB — SEDIMENTATION RATE: Sed Rate: 12 mm/hr (ref 0–16)

## 2017-11-03 LAB — C-REACTIVE PROTEIN: CRP: 2.9 mg/dL — ABNORMAL HIGH (ref <1.0)

## 2017-11-03 LAB — GLUCOSE, CAPILLARY: Glucose-Capillary: 175 mg/dL — ABNORMAL HIGH (ref 70–99)

## 2017-11-03 MED ORDER — DUTASTERIDE 0.5 MG PO CAPS
0.5000 mg | ORAL_CAPSULE | Freq: Every day | ORAL | Status: DC
  Administered 2017-11-04: 0.5 mg via ORAL
  Filled 2017-11-03: qty 1

## 2017-11-03 MED ORDER — BROMOCRIPTINE MESYLATE 2.5 MG PO TABS
5.0000 mg | ORAL_TABLET | Freq: Every day | ORAL | Status: DC
  Administered 2017-11-04 – 2017-11-07 (×3): 5 mg via ORAL
  Filled 2017-11-03 (×4): qty 2

## 2017-11-03 MED ORDER — FELODIPINE ER 5 MG PO TB24
5.0000 mg | ORAL_TABLET | Freq: Every day | ORAL | Status: DC
  Administered 2017-11-04 – 2017-11-05 (×2): 5 mg via ORAL
  Filled 2017-11-03 (×5): qty 1

## 2017-11-03 MED ORDER — INSULIN ASPART 100 UNIT/ML ~~LOC~~ SOLN: 0.0000 [IU] | Freq: Every day | SUBCUTANEOUS | Status: DC

## 2017-11-03 MED ORDER — ACETAMINOPHEN 325 MG PO TABS
650.0000 mg | ORAL_TABLET | Freq: Four times a day (QID) | ORAL | Status: DC | PRN
  Filled 2017-11-03: qty 2

## 2017-11-03 MED ORDER — OXYCODONE-ACETAMINOPHEN 5-325 MG PO TABS
1.0000 | ORAL_TABLET | Freq: Once | ORAL | Status: AC
  Administered 2017-11-03: 1 via ORAL
  Filled 2017-11-03: qty 1

## 2017-11-03 MED ORDER — ENOXAPARIN SODIUM 40 MG/0.4ML ~~LOC~~ SOLN
40.0000 mg | SUBCUTANEOUS | Status: DC
  Administered 2017-11-03 – 2017-11-06 (×4): 40 mg via SUBCUTANEOUS
  Filled 2017-11-03 (×4): qty 0.4

## 2017-11-03 MED ORDER — INSULIN ASPART 100 UNIT/ML ~~LOC~~ SOLN: 0.0000 [IU] | Freq: Three times a day (TID) | SUBCUTANEOUS | Status: DC

## 2017-11-03 MED ORDER — SIMVASTATIN 40 MG PO TABS
40.0000 mg | ORAL_TABLET | Freq: Every day | ORAL | Status: DC
  Administered 2017-11-03 – 2017-11-06 (×4): 40 mg via ORAL
  Filled 2017-11-03 (×4): qty 1

## 2017-11-03 MED ORDER — ONDANSETRON HCL 4 MG PO TABS: 4.0000 mg | ORAL_TABLET | Freq: Four times a day (QID) | ORAL | Status: DC | PRN

## 2017-11-03 MED ORDER — ZOLPIDEM TARTRATE 5 MG PO TABS: 5.0000 mg | ORAL_TABLET | Freq: Every evening | ORAL | Status: DC | PRN

## 2017-11-03 MED ORDER — LORAZEPAM 1 MG PO TABS: 1.0000 mg | ORAL_TABLET | Freq: Four times a day (QID) | ORAL | Status: AC | PRN

## 2017-11-03 MED ORDER — FOLIC ACID 1 MG PO TABS
1.0000 mg | ORAL_TABLET | Freq: Every day | ORAL | Status: DC
  Administered 2017-11-03 – 2017-11-07 (×4): 1 mg via ORAL
  Filled 2017-11-03 (×4): qty 1

## 2017-11-03 MED ORDER — INSULIN ASPART 100 UNIT/ML ~~LOC~~ SOLN
0.0000 [IU] | Freq: Every day | SUBCUTANEOUS | Status: DC
  Administered 2017-11-06: 3 [IU] via SUBCUTANEOUS

## 2017-11-03 MED ORDER — PANTOPRAZOLE SODIUM 40 MG PO TBEC
40.0000 mg | DELAYED_RELEASE_TABLET | Freq: Every day | ORAL | Status: DC
  Administered 2017-11-04 – 2017-11-07 (×3): 40 mg via ORAL
  Filled 2017-11-03 (×3): qty 1

## 2017-11-03 MED ORDER — MORPHINE SULFATE (PF) 2 MG/ML IV SOLN: 2.0000 mg | INTRAVENOUS | Status: DC | PRN

## 2017-11-03 MED ORDER — ACETAMINOPHEN 650 MG RE SUPP: 650.0000 mg | Freq: Four times a day (QID) | RECTAL | Status: DC | PRN

## 2017-11-03 MED ORDER — VANCOMYCIN HCL 10 G IV SOLR
2000.0000 mg | Freq: Once | INTRAVENOUS | Status: AC
  Administered 2017-11-03: 2000 mg via INTRAVENOUS
  Filled 2017-11-03: qty 2000

## 2017-11-03 MED ORDER — LORAZEPAM 2 MG/ML IJ SOLN: 0.0000 mg | Freq: Four times a day (QID) | INTRAMUSCULAR | Status: AC

## 2017-11-03 MED ORDER — VITAMIN B-1 100 MG PO TABS
100.0000 mg | ORAL_TABLET | Freq: Every day | ORAL | Status: DC
  Administered 2017-11-03 – 2017-11-07 (×4): 100 mg via ORAL
  Filled 2017-11-03 (×4): qty 1

## 2017-11-03 MED ORDER — LISINOPRIL-HYDROCHLOROTHIAZIDE 20-25 MG PO TABS: 2.0000 | ORAL_TABLET | Freq: Every day | ORAL | Status: DC

## 2017-11-03 MED ORDER — ADULT MULTIVITAMIN W/MINERALS CH
1.0000 | ORAL_TABLET | Freq: Every day | ORAL | Status: DC
  Administered 2017-11-03 – 2017-11-07 (×4): 1 via ORAL
  Filled 2017-11-03 (×4): qty 1

## 2017-11-03 MED ORDER — THIAMINE HCL 100 MG/ML IJ SOLN
100.0000 mg | Freq: Every day | INTRAMUSCULAR | Status: DC
  Filled 2017-11-03: qty 2

## 2017-11-03 MED ORDER — VANCOMYCIN HCL IN DEXTROSE 1-5 GM/200ML-% IV SOLN
1000.0000 mg | Freq: Two times a day (BID) | INTRAVENOUS | Status: DC
  Administered 2017-11-04: 1000 mg via INTRAVENOUS
  Filled 2017-11-03: qty 200

## 2017-11-03 MED ORDER — LISINOPRIL 40 MG PO TABS
40.0000 mg | ORAL_TABLET | Freq: Every day | ORAL | Status: DC
  Administered 2017-11-04 – 2017-11-07 (×3): 40 mg via ORAL
  Filled 2017-11-03 (×3): qty 1

## 2017-11-03 MED ORDER — LORAZEPAM 2 MG/ML IJ SOLN
0.0000 mg | Freq: Two times a day (BID) | INTRAMUSCULAR | Status: DC
  Administered 2017-11-05: 2 mg via INTRAVENOUS
  Filled 2017-11-03: qty 1

## 2017-11-03 MED ORDER — HYDRALAZINE HCL 20 MG/ML IJ SOLN: 5.0000 mg | INTRAMUSCULAR | Status: DC | PRN

## 2017-11-03 MED ORDER — LORAZEPAM 2 MG/ML IJ SOLN: 1.0000 mg | Freq: Four times a day (QID) | INTRAMUSCULAR | Status: AC | PRN

## 2017-11-03 MED ORDER — FINASTERIDE 5 MG PO TABS
5.0000 mg | ORAL_TABLET | Freq: Every day | ORAL | Status: DC
  Administered 2017-11-04 – 2017-11-07 (×3): 5 mg via ORAL
  Filled 2017-11-03 (×3): qty 1

## 2017-11-03 MED ORDER — VANCOMYCIN HCL 10 G IV SOLR: 1250.0000 mg | Freq: Two times a day (BID) | INTRAVENOUS | Status: DC

## 2017-11-03 MED ORDER — HYDROCHLOROTHIAZIDE 50 MG PO TABS
50.0000 mg | ORAL_TABLET | Freq: Every day | ORAL | Status: DC
  Administered 2017-11-04 – 2017-11-07 (×3): 50 mg via ORAL
  Filled 2017-11-03: qty 2
  Filled 2017-11-03: qty 1
  Filled 2017-11-03: qty 2
  Filled 2017-11-03 (×2): qty 1
  Filled 2017-11-03: qty 2
  Filled 2017-11-03: qty 1

## 2017-11-03 MED ORDER — OXYCODONE-ACETAMINOPHEN 5-325 MG PO TABS
1.0000 | ORAL_TABLET | ORAL | Status: DC | PRN
  Administered 2017-11-04 – 2017-11-05 (×2): 1 via ORAL
  Filled 2017-11-03 (×2): qty 1

## 2017-11-03 MED ORDER — ONDANSETRON HCL 4 MG/2ML IJ SOLN: 4.0000 mg | Freq: Four times a day (QID) | INTRAMUSCULAR | Status: DC | PRN

## 2017-11-03 MED ORDER — SENNOSIDES-DOCUSATE SODIUM 8.6-50 MG PO TABS: 1.0000 | ORAL_TABLET | Freq: Every evening | ORAL | Status: DC | PRN

## 2017-11-03 NOTE — ED Provider Notes (Signed)
Patient placed in Quick Look pathway, seen and evaluated   Chief Complaint: left leg pain  HPI:   67 y.o. male with history of DM presents with worsening leg pain since Saturday.  Patient reports that he fell off a tractor trailer on Saturday and bumped his left lower leg.  He noted to have an abrasion and contusion over that area.  He was seen at urgent care and prescribed Keflex.  He notes since that time he has had spreading pain, edema, redness and heat of the area.  He has history of the clindamycin yesterday but notes that the redness has been worsening.  He sent her for further evaluation.  He has had x-rays of this x2 that showed no evidence of fracture.  He has had 2 doses of clindamycin 300 mg prior to arrival.  He denies any fever, nausea or vomiting.  ROS: leg pain, erythema, edema  Physical Exam:   Gen: No distress  Neuro: Awake and Alert  Skin: Warm    Focused Exam: Patient with noted contusion of the left lower leg.  Surrounding this is a large area of erythema.  Patient has noted pitting edema and heat compared to the right leg.  He is neurovascular intact distally and compartments are soft.    Initiation of care has begun. The patient has been counseled on the process, plan, and necessity for staying for the completion/evaluation, and the remainder of the medical screening examination   Princella Pellegrini 11/03/17 1430    Little, Ambrose Finland, MD 11/04/17 [phone]

## 2017-11-03 NOTE — ED Triage Notes (Signed)
Pt. Arrived from home with c/o LLE pain, swelling, and redness after hitting his leg on metal step Friday morning. Pt has been seen at PCP X2 and was told to come to ED for evaluation.

## 2017-11-03 NOTE — Progress Notes (Signed)
Preliminary notes--Left lower extremity venous duplex exam completed. Negative for DVT.  A 2.98x1.39x2.46cm heterogenous structure with irregular margin at mid to distal calf anteriorly, where patient points the most pain with erythema. Etiology unknown.  Hongying Porscha Axley (RDMS RVT) 11/03/17 3:10 PM

## 2017-11-03 NOTE — H&P (Signed)
History and Physical    Adam Cardenas:[id number] DOB: August 22, 1950 DOA: 11/03/2017  Referring MD/NP/PA:   PCP: Carlos Levering, PA-C   Patient coming from:  The patient is coming from home.  At baseline, pt is independent for most of ADL.      Chief Complaint: left leg pain  HPI: Adam Cardenas is a 67 y.o. male with medical history significant of hypertension, hyperlipidemia, diabetes mellitus, GERD, BPH, OSA on CPAP, who presents with left leg pain.  Patient states that he fell off a tractor trailer on Saturday and bumped his left lower leg. No head or neck injury. No LOC. Initially he just noted contusion and skin abrasion in the left mid shin area, without penetration wound in the leg.  He states that the pain has worsened since 2 days ago. He developed erythema and warmth in the left mid shin area, which has been spreading down to the lateral side of left foot.  The pain is constant, can reach 10 out of 10 severity, sharp, nonradiating.  Denies any fever or chills.  Patient was seen in urgent care, and put on Keflex two days ago, without improvement, then switched to clindamycin yesterday, still no improvement.  His pain has been persistent, therefore comes back to emergency room for further evaluation and treatment. Patient denies any chest pain, shortness breath, cough, fever, chills.  No nausea, vomiting, diarrhea or abdominal pain.  No symptoms of UTI or unilateral weakness.  ED Course: pt was found to have WBC 7.4, lactic acid of 1.75, electrolytes renal function okay, temperature normal, no tachycardia, no tachypnea, oxygen satting 93% on room air.  X-ray of left tibia/fibula, left foot pain and ankle are negative for bony fracture.  Lower extremity venous Dopplers negative for DVT.  Review of Systems:   General: no fevers, chills, no body weight gain, has fatigue HEENT: no blurry vision, hearing changes or sore throat Respiratory: no dyspnea, coughing, wheezing CV: no chest  pain, no palpitations GI: no nausea, vomiting, abdominal pain, diarrhea, constipation GU: no dysuria, burning on urination, increased urinary frequency, hematuria  Ext: no leg edema Neuro: no unilateral weakness, numbness, or tingling, no vision change or hearing loss Skin: Has erythema, warmth, tenderness and swelling around left mid shin area, spreading down to the lateral side of left foot. MSK: No muscle spasm, no deformity, no limitation of range of movement in spin Heme: No easy bruising.  Travel history: No recent long distant travel.  Allergy:  Allergies  Allergen Reactions  . 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 Sitagliptin Other (See Comments)    Pancreatitis     Past Medical History:  Diagnosis Date  . Diabetes mellitus   . HTN (hypertension)   . Hypercholesterolemia   . Seasonal allergic rhinitis   . Sleep apnea     Past Surgical History:  Procedure Laterality Date  . repair deviated septum      Social History:  reports that he quit smoking about 19 years ago. His smoking use included cigarettes. He has a 30.00 pack-year smoking history. He has never used smokeless tobacco. He reports that he drinks alcohol. He reports that he does not use drugs.  Family History:  Family History  Problem Relation Age of Onset  . Cancer Father   . Diabetes Maternal Grandfather      Prior to Admission medications   Medication Sig Start  Date End Date Taking? Authorizing Provider  aspirin 81 MG tablet Take 81 mg by mouth daily.    [provider]  bromocriptine (PARLODEL) 5 MG capsule Take 1 capsule (5 mg total) by mouth daily. 09/27/17   Renato Shin, MD  cetirizine (ZYRTEC) 10 MG tablet Take 10 mg by mouth daily.      [provider]  dutasteride (AVODART) 0.5 MG capsule Take 0.5 mg by mouth daily.      [provider]  empagliflozin (JARDIANCE) 25 MG TABS tablet Take 25 mg by  mouth daily. 06/20/17   Renato Shin, MD  fluticasone [facility]) 50 MCG/ACT nasal spray Place 1-2 sprays into the nose daily as needed.      [provider]  glucosamine-chondroitin 500-400 MG tablet Take 1 tablet by mouth 2 (two) times daily.    [provider]  glyBURIDE (DIABETA) 5 MG tablet Take 1 tablet (5 mg total) by mouth daily with breakfast. 09/27/17   Renato Shin, MD  lisinopril (PRINIVIL,ZESTRIL) 20 MG tablet Take 1/2 tab by mouth daily     [provider]  metFORMIN (GLUCOPHAGE-XR) 500 MG 24 hr tablet Take 4 tablets (2,000 mg total) by mouth daily. 04/22/17   Renato Shin, MD  Multiple Vitamins-Minerals (ONE-A-DAY 50 PLUS) TABS Take 1 tablet by mouth daily.    [provider]  simvastatin (ZOCOR) 40 MG tablet Take 40 mg by mouth at bedtime.      [provider]    Physical Exam: Vitals:   11/03/17 1915 11/03/17 1945 11/03/17 2115 11/03/17 2207  BP: 129/75 117/69 (!) 131/92 (!) 151/78  Pulse: 64 65 71 72  Resp: _0 Temp:    98.1 F (36.7 C)  TempSrc:    Oral  SpO2: 95% 93% 99% 100%  Weight:      Height:       General: Not in acute distress HEENT:       Eyes: PERRL, EOMI, no scleral icterus.       ENT: No discharge from the ears and nose, no pharynx injection, no tonsillar enlargement.        Neck: No JVD, no bruit, no mass felt. Heme: No neck lymph node enlargement. Cardiac: S1/S2, RRR, No murmurs, No gallops or rubs. Respiratory: Good air movement bilaterally. No rales, wheezing, rhonchi or rubs. GI: Soft, nondistended, nontender, no rebound pain, no organomegaly, BS present. GU: No hematuria Ext: No pitting leg edema bilaterally. 2+DP/PT pulse bilaterally. Musculoskeletal: No joint deformities, No joint redness or warmth, no limitation of ROM in spin. Skin: No rashes.  Neuro: Alert, oriented X3, cranial nerves II-XII grossly intact, moves all extremities normally. Muscle strength 5/5 in all extremities, sensation to  light touch intact. Brachial reflex 2+ bilaterally. Knee reflex 1+ bilaterally. Negative Babinski's sign. Normal finger to nose test. Psych: Patient is not psychotic, no suicidal or hemocidal ideation.  Labs on Admission: I have personally reviewed following labs and imaging studies  CBC: Recent Labs  Lab 11/03/17 1519  WBC 7.4  NEUTROABS 5.4  HGB 15.1  HCT 46.0  MCV 93.1  PLT 409   Basic Metabolic Panel: Recent Labs  Lab 11/03/17 1519  NA 137  K 4.1  CL 97*  CO2 26  GLUCOSE 306*  BUN 21  CREATININE 1.11  CALCIUM 10.2   GFR: Estimated Creatinine Clearance: 79.2 mL/min (by C-G formula based on SCr of 1.11 mg/dL). Liver Function Tests: Recent Labs  Lab 11/03/17 1519  AST 32  ALT 43  ALKPHOS 87  BILITOT 0.9  PROT 7.5  ALBUMIN 4.5   No results for input(s): LIPASE, AMYLASE in the last 168 hours. No results for input(s): AMMONIA in the last 168 hours. Coagulation Profile: No results for input(s): INR, PROTIME in the last 168 hours. Cardiac Enzymes: No results for input(s): CKTOTAL, CKMB, CKMBINDEX, TROPONINI in the last 168 hours. BNP (last 3 results) No results for input(s): PROBNP in the last 8760 hours. HbA1C: No results for input(s): HGBA1C in the last 72 hours. CBG: Recent Labs  Lab 11/03/17 2215  GLUCAP 175*   Lipid Profile: No results for input(s): CHOL, HDL, LDLCALC, TRIG, CHOLHDL, LDLDIRECT in the last 72 hours. Thyroid Function Tests: No results for input(s): TSH, T4TOTAL, FREET4, T3FREE, THYROIDAB in the last 72 hours. Anemia Panel: No results for input(s): VITAMINB12, FOLATE, FERRITIN, TIBC, IRON, RETICCTPCT in the last 72 hours. Urine analysis: No results found for: COLORURINE, APPEARANCEUR, LABSPEC, PHURINE, GLUCOSEU, HGBUR, BILIRUBINUR, KETONESUR, PROTEINUR, UROBILINOGEN, NITRITE, LEUKOCYTESUR Sepsis Labs: _0 (procalcitonin:4,lacticidven:4) )No results found for this or any previous visit (from the past 240 hour(s)).    Radiological Exams on Admission: Dg Tibia/fibula Left  Result Date: 11/03/2017 CLINICAL DATA:  Status post fall with left leg pain. EXAM: LEFT TIBIA AND FIBULA - 2 VIEW COMPARISON:  None. FINDINGS: There is no evidence of fracture or other focal bone lesions. Soft tissues are unremarkable. IMPRESSION: Negative. Electronically Signed   By: Abelardo Diesel M.D.   On: 11/03/2017 20:40   Dg Ankle 2 Views Left  Result Date: 11/03/2017 CLINICAL DATA:  Status post fall with left ankle pain. EXAM: LEFT ANKLE - 2 VIEW COMPARISON:  None. FINDINGS: There is no evidence of fracture, dislocation, or joint effusion. There is soft tissue swelling around the ankle. IMPRESSION: No acute fracture or dislocation. Electronically Signed   By: Abelardo Diesel M.D.   On: 11/03/2017 20:41   Dg Foot 2 Views Left  Result Date: 11/03/2017 CLINICAL DATA:  Status post fall with left foot pain. EXAM: LEFT FOOT - 2 VIEW COMPARISON:  None. FINDINGS: There is no evidence of fracture or dislocation. Soft tissues are unremarkable. IMPRESSION: No acute fracture or dislocation. Electronically Signed   By: Abelardo Diesel M.D.   On: 11/03/2017 20:39     EKG:  Not done in ED, will get one.   Assessment/Plan Principal Problem:   Cellulitis of left leg Active Problems:   Diabetes mellitus without complication (HCC)   HYPERCHOLESTEROLEMIA   Obstructive sleep apnea   HTN (hypertension)   Alcohol use   BPH (benign prostatic hyperplasia)   GERD (gastroesophageal reflux disease)   Cellulitis of left leg: Patient is not septic.  No fever or leukocytosis.  Lactic acid is normal.  Hemodynamically stable.  Patient failed outpatient oral antibiotic treatment.  Will need IV antibiotics.  Lower extremity Dopplers negative for DVT.  No bony fracture on x-ray.   - will place on med-surg for obs - Empiric antimicrobial treatment with vancomycin  - PRN Zofran for nausea, morphine and Percocet for pain - Blood cultures x 2  - ESR and  CRP  Diabetes mellitus without complication ([place]): Last A1c 8.5 on 09/27/17, poorly controled. Patient is taking glyburide, Jardiance, metformin at home -SSI  HYPERCHOLESTEROLEMIA: -zocor  Obstructive sleep apnea -CPAP  Essential hypertension: -IV Hydralazine prn -Continue home medications: Felodipine, Prinzide  GERD: -Protonix  BPH: stable - Continue Avodart and Proscar  Alcohol use: Patient states that he drinks 2-3 times per week, several ounce of  bourbon each time, last drink was last night, 4 ounce of bourbon. Currently no signs of withdrawal.  -CiWA protocol    DVT ppx: SQ Lovenox Code Status: Full code Family Communication: None at bed side.   Disposition Plan:  Anticipate discharge back to previous home environment Consults called:  none Admission status: medical floor/obs        Date of Service 11/03/2017    Ivor Costa [facility] Pager [phone]  If 7PM-7AM, please contact night-coverage [email or web address] Password [facility] 11/03/2017, 10:38 PM

## 2017-11-03 NOTE — Progress Notes (Addendum)
Pharmacy Antibiotic Note  Adam Cardenas is a 67 y.o. male admitted on 11/03/2017 with cellulitis.  Pharmacy has been consulted for vancomycin dosing. WBC 7.4, afebrile, scr 1.11  Plan: Vancomycin 2000mg  IV x1,, then 1000mg  IV Q12H  F/u LOT/de-escalation, renal function, cultures    Height: 6\' 1"  (185.4 cm) Weight: 220 lb (99.8 kg) IBW/kg (Calculated) : 79.9  Temp (24hrs), Avg:98.5 F (36.9 C), Min:98 F (36.7 C), Max:98.9 F (37.2 C)  Recent Labs  Lab 11/03/17 1519 11/03/17 1542  WBC 7.4  --   CREATININE 1.11  --   LATICACIDVEN  --  1.75    Estimated Creatinine Clearance: 79.2 mL/min (by C-G formula based on SCr of 1.11 mg/dL).    No Known Allergies  Antimicrobials this admission: Vanc 9/26  Dose adjustments this admission:   Microbiology results:   Thank you for allowing pharmacy to be a part of this patient's care.  Ewing Schlein, PharmD PGY1 Pharmacy Resident 11/03/2017    9:20 PM

## 2017-11-03 NOTE — ED Provider Notes (Signed)
[facility] EMERGENCY DEPARTMENT Provider Note   CSN: [id number] Arrival date & time: 11/03/17  1346     History   Chief Complaint Chief Complaint  Patient presents with  . Leg Pain    HPI ROBERTO ROMANOSKI is a 67 y.o. male.  HPI  Patient is a 67yo male with PMHx of DM, HTN, HLD and OSA who presents with worsening LLE erythema, warmth, swelling, and pain x injury on Saturday.  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  He states he may have slipped on diesel fuel.  Patient was seen in urgent care and put on Keflex.  XR that time without acute fracture.  He was subsequently switched to clindamycin yesterday and is thus far received 2 doses given worsening of symptoms however has had no improvement.  Pain is constant and radiates from mid left calf to left foot.  Nonradiating.  He denies fevers, nausea, vomiting, diarrhea, abdominal pain, chest pain, and shortness of breath.      Past Medical History:  Diagnosis Date  . Diabetes mellitus   . HTN (hypertension)   . Hypercholesterolemia   . Seasonal allergic rhinitis   . Sleep apnea     Patient Active Problem List   Diagnosis Date Noted  . Cellulitis of left leg 11/03/2017  . Alcohol use 11/03/2017  . BPH (benign prostatic hyperplasia) 11/03/2017  . GERD (gastroesophageal reflux disease) 11/03/2017  . HTN (hypertension)   . Hypercholesterolemia   . Diabetes mellitus without complication (HCC) 03/15/2007  . HYPERCHOLESTEROLEMIA 03/15/2007  . ALLERGIC RHINITIS, SEASONAL 03/15/2007  . Obstructive sleep apnea 03/15/2007  . HYPERTENSION NEC 03/15/2007    Past Surgical History:  Procedure Laterality Date  . repair deviated septum          Home Medications    Prior to Admission medications   Medication Sig Start Date End Date Taking? Authorizing Provider  bromocriptine (PARLODEL) 5 MG capsule Take 1 capsule (5 mg total) by  mouth daily. 09/27/17  Yes Romero Belling, MD  clindamycin (CLEOCIN) 300 MG capsule Take 300 mg by mouth 3 (three) times daily. FOR 10 DAYS 11/03/17 11/11/17 Yes [provider]  dutasteride (AVODART) 0.5 MG capsule Take 0.5 mg by mouth daily.     Yes [provider]  empagliflozin (JARDIANCE) 25 MG TABS tablet Take 25 mg by mouth daily. 06/20/17  Yes Romero Belling, MD  felodipine (PLENDIL) 5 MG 24 hr tablet Take 5 mg by mouth daily.   Yes [provider]  finasteride (PROSCAR) 5 MG tablet Take 5 mg by mouth daily. 10/01/17  Yes [provider]  glyBURIDE (DIABETA) 5 MG tablet Take 1 tablet (5 mg total) by mouth daily with breakfast. 09/27/17  Yes Romero Belling, MD  lisinopril-hydrochlorothiazide (PRINZIDE,ZESTORETIC) 20-25 MG tablet Take 2 tablets by mouth daily.    Yes [provider]  metFORMIN (GLUCOPHAGE-XR) 500 MG 24 hr tablet Take 4 tablets (2,000 mg total) by mouth daily. Patient taking differently: Take 2,000 mg by mouth every evening.  04/22/17  Yes Romero Belling, MD  omeprazole (PRILOSEC) 20 MG capsule Take 20 mg by mouth daily. 10/02/17  Yes [provider]  simvastatin (ZOCOR) 40 MG tablet Take 40 mg by mouth at bedtime.     Yes [provider]    Family History Family History  Problem Relation Age of Onset  . Cancer Father   . Diabetes Maternal  Grandfather     Social History Social History   Tobacco Use  . Smoking status: Former Smoker    Packs/day: 1.00    Years: 30.00    Pack years: 30.00    Types: Cigarettes    Last attempt to quit: 02/08/1998    Years since quitting: 19.7  . Smokeless tobacco: Never Used  Substance Use Topics  . Alcohol use: Yes    Comment: rare-liquor  . Drug use: No     Allergies   Insulins and Sitagliptin   Review of Systems Review of Systems  Constitutional: Negative for chills and fever.  HENT: Negative for sore throat.   Eyes: Negative for pain and visual disturbance.    Respiratory: Negative for cough and shortness of breath.   Cardiovascular: Negative for chest pain and palpitations.  Gastrointestinal: Negative for abdominal pain, diarrhea, nausea and vomiting.  Genitourinary: Negative for dysuria and hematuria.  Musculoskeletal:       +LLE swelling and pain  Skin: Positive for rash (erythema to LLE).  Neurological: Negative for seizures and syncope.  All other systems reviewed and are negative.    Physical Exam Updated Vital Signs BP (!) 151/78 (BP Location: Right Arm)   Pulse 72   Temp 98.1 F (36.7 C) (Oral)   Resp 18   Ht 6\' 1"  (1.854 m)   Wt 99.8 kg   SpO2 100%   BMI 29.03 kg/m   Physical Exam  Constitutional: He is oriented to person, place, and time. He appears well-developed and well-nourished. No distress.  HENT:  Head: Normocephalic and atraumatic.  Mouth/Throat: Oropharynx is clear and moist.  Eyes: Pupils are equal, round, and reactive to light. Conjunctivae are normal.  Neck: Normal range of motion. Neck supple.  Cardiovascular: Normal rate, regular rhythm and intact distal pulses.  No murmur heard. Pulmonary/Chest: Effort normal and breath sounds normal. No respiratory distress.  Abdominal: Soft. He exhibits no distension. There is no tenderness. There is no guarding.  Neurological: He is alert and oriented to person, place, and time.  Skin: Skin is warm and dry.  Distal LLE with 2+ pitting edema from mid calf to foot.  Midline hematoma present.  Skin is erythematous and warm to touch.  He is a NVI 2+ DP pulses documented.  Psychiatric: He has a normal mood and affect.  Nursing note and vitals reviewed.  LLE     ED Treatments / Results  Labs (all labs ordered are listed, but only abnormal results are displayed) Labs Reviewed  COMPREHENSIVE METABOLIC PANEL - Abnormal; Notable for the following components:      Result Value   Chloride 97 (*)    Glucose, Bld 306 (*)    All other components within normal limits   C-REACTIVE PROTEIN - Abnormal; Notable for the following components:   CRP 2.9 (*)    All other components within normal limits  GLUCOSE, CAPILLARY - Abnormal; Notable for the following components:   Glucose-Capillary 175 (*)    All other components within normal limits  CULTURE, BLOOD (ROUTINE X 2)  CULTURE, BLOOD (ROUTINE X 2)  CBC WITH DIFFERENTIAL/PLATELET  SEDIMENTATION RATE  HIV ANTIBODY (ROUTINE TESTING W REFLEX)  BASIC METABOLIC PANEL  CBC  I-STAT CG4 LACTIC ACID, ED  I-STAT CG4 LACTIC ACID, ED    EKG None  Radiology Dg Tibia/fibula Left  Result Date: 11/03/2017 CLINICAL DATA:  Status post fall with left leg pain. EXAM: LEFT TIBIA AND FIBULA - 2 VIEW COMPARISON:  None. FINDINGS: There  is no evidence of fracture or other focal bone lesions. Soft tissues are unremarkable. IMPRESSION: Negative. Electronically Signed   By: Sherian Rein M.D.   On: 11/03/2017 20:40   Dg Ankle 2 Views Left  Result Date: 11/03/2017 CLINICAL DATA:  Status post fall with left ankle pain. EXAM: LEFT ANKLE - 2 VIEW COMPARISON:  None. FINDINGS: There is no evidence of fracture, dislocation, or joint effusion. There is soft tissue swelling around the ankle. IMPRESSION: No acute fracture or dislocation. Electronically Signed   By: Sherian Rein M.D.   On: 11/03/2017 20:41   Dg Foot 2 Views Left  Result Date: 11/03/2017 CLINICAL DATA:  Status post fall with left foot pain. EXAM: LEFT FOOT - 2 VIEW COMPARISON:  None. FINDINGS: There is no evidence of fracture or dislocation. Soft tissues are unremarkable. IMPRESSION: No acute fracture or dislocation. Electronically Signed   By: Sherian Rein M.D.   On: 11/03/2017 20:39    Procedures Procedures (including critical care time)  Medications Ordered in ED Medications  oxyCODONE-acetaminophen (PERCOCET/ROXICET) 5-325 MG per tablet 1 tablet (has no administration in time range)  morphine 2 MG/ML injection 2 mg (has no administration in time range)   insulin aspart (novoLOG) injection 0-9 Units (has no administration in time range)  insulin aspart (novoLOG) injection 0-5 Units (0 Units Subcutaneous Not Given 11/03/17 2242)  enoxaparin (LOVENOX) injection 40 mg (40 mg Subcutaneous Given 11/03/17 2239)  acetaminophen (TYLENOL) tablet 650 mg (has no administration in time range)    Or  acetaminophen (TYLENOL) suppository 650 mg (has no administration in time range)  senna-docusate (Senokot-S) tablet 1 tablet (has no administration in time range)  ondansetron (ZOFRAN) tablet 4 mg (has no administration in time range)    Or  ondansetron (ZOFRAN) injection 4 mg (has no administration in time range)  zolpidem (AMBIEN) tablet 5 mg (has no administration in time range)  hydrALAZINE (APRESOLINE) injection 5 mg (has no administration in time range)  LORazepam (ATIVAN) tablet 1 mg (has no administration in time range)    Or  LORazepam (ATIVAN) injection 1 mg (has no administration in time range)  thiamine (VITAMIN B-1) tablet 100 mg (100 mg Oral Given 11/03/17 2240)    Or  thiamine (B-1) injection 100 mg ( Intravenous See Alternative 11/03/17 2240)  folic acid (FOLVITE) tablet 1 mg (1 mg Oral Given 11/03/17 2239)  multivitamin with minerals tablet 1 tablet (1 tablet Oral Given 11/03/17 2240)  LORazepam (ATIVAN) injection 0-4 mg (0 mg Intravenous Not Given 11/03/17 2245)    Followed by  LORazepam (ATIVAN) injection 0-4 mg (has no administration in time range)  vancomycin (VANCOCIN) IVPB 1000 mg/200 mL premix (has no administration in time range)  felodipine (PLENDIL) 24 hr tablet 5 mg (has no administration in time range)  simvastatin (ZOCOR) tablet 40 mg (40 mg Oral Given 11/03/17 2249)  dutasteride (AVODART) capsule 0.5 mg (has no administration in time range)  finasteride (PROSCAR) tablet 5 mg (has no administration in time range)  bromocriptine (PARLODEL) tablet 5 mg (has no administration in time range)  pantoprazole (PROTONIX) EC tablet 40 mg  (has no administration in time range)  lisinopril (PRINIVIL,ZESTRIL) tablet 40 mg (has no administration in time range)  hydrochlorothiazide (HYDRODIURIL) tablet 50 mg (has no administration in time range)  oxyCODONE-acetaminophen (PERCOCET/ROXICET) 5-325 MG per tablet 1 tablet (1 tablet Oral Given 11/03/17 1513)  vancomycin (VANCOCIN) 2,000 mg in sodium chloride 0.9 % 500 mL IVPB (0 mg Intravenous Stopped 11/03/17 2345)  Initial Impression / Assessment and Plan / ED Course  I have reviewed the triage vital signs and the nursing notes.  Pertinent labs & imaging results that were available during my care of the patient were reviewed by me and considered in my medical decision making (see chart for details).     Patient is a 67yo male with PMHx of DM, HTN, HLD and OSA who presents with worsening LLE erythema, warmth, swelling, and pain x injury on Saturday.   He has failed outpatient management with Keflex and clindamycin.  No systemic symptoms reported.  On arrival he is HDS and otherwise well-appearing.  Ultrasound studies negative for DVT.  XR of left hip/fib, left ankle, left foot negative for acute fracture.  Doubt compartment syndrome, septic joint, or necrotizing fasciitis.  Pulses intact without sensory deficits or overlying joint edema/erythema.  No crepitus.  Will give IV vancomycin after cultures obtained.  No lactic acidosis.  Labs unremarkable.  Discussed case with hospitalist who will admit for IV ABx.  Patient and family updated at bedside with plan who verbalized their understanding and agreement.  HDS at transfer.  The plan for this patient was discussed with Dr. Jeraldine Loots who voiced agreement and who oversaw evaluation and treatment of this patient.   Final Clinical Impressions(s) / ED Diagnoses   Final diagnoses:  Essential hypertension  Hypercholesterolemia    ED Discharge Orders    None       Abelardo Diesel, MD 11/04/17 Shelby Dubin    Gerhard Munch, MD 11/05/17  [phone]

## 2017-11-04 ENCOUNTER — Encounter (HOSPITAL_COMMUNITY): Payer: Self-pay | Admitting: Internal Medicine

## 2017-11-04 DIAGNOSIS — L03116 Cellulitis of left lower limb: Secondary | ICD-10-CM | POA: Diagnosis present

## 2017-11-04 DIAGNOSIS — S8012XA Contusion of left lower leg, initial encounter: Secondary | ICD-10-CM | POA: Diagnosis present

## 2017-11-04 DIAGNOSIS — E7800 Pure hypercholesterolemia, unspecified: Secondary | ICD-10-CM | POA: Diagnosis present

## 2017-11-04 DIAGNOSIS — E785 Hyperlipidemia, unspecified: Secondary | ICD-10-CM | POA: Diagnosis present

## 2017-11-04 DIAGNOSIS — G4733 Obstructive sleep apnea (adult) (pediatric): Secondary | ICD-10-CM | POA: Diagnosis present

## 2017-11-04 DIAGNOSIS — Z833 Family history of diabetes mellitus: Secondary | ICD-10-CM | POA: Diagnosis not present

## 2017-11-04 DIAGNOSIS — Z789 Other specified health status: Secondary | ICD-10-CM | POA: Diagnosis not present

## 2017-11-04 DIAGNOSIS — L0390 Cellulitis, unspecified: Secondary | ICD-10-CM | POA: Diagnosis present

## 2017-11-04 DIAGNOSIS — Z7289 Other problems related to lifestyle: Secondary | ICD-10-CM | POA: Diagnosis not present

## 2017-11-04 DIAGNOSIS — E1165 Type 2 diabetes mellitus with hyperglycemia: Secondary | ICD-10-CM | POA: Diagnosis not present

## 2017-11-04 DIAGNOSIS — Z79899 Other long term (current) drug therapy: Secondary | ICD-10-CM | POA: Diagnosis not present

## 2017-11-04 DIAGNOSIS — Z888 Allergy status to other drugs, medicaments and biological substances status: Secondary | ICD-10-CM | POA: Diagnosis not present

## 2017-11-04 DIAGNOSIS — Z7984 Long term (current) use of oral hypoglycemic drugs: Secondary | ICD-10-CM | POA: Diagnosis not present

## 2017-11-04 DIAGNOSIS — I10 Essential (primary) hypertension: Secondary | ICD-10-CM | POA: Diagnosis present

## 2017-11-04 DIAGNOSIS — Z87891 Personal history of nicotine dependence: Secondary | ICD-10-CM | POA: Diagnosis not present

## 2017-11-04 DIAGNOSIS — J302 Other seasonal allergic rhinitis: Secondary | ICD-10-CM | POA: Diagnosis present

## 2017-11-04 DIAGNOSIS — E119 Type 2 diabetes mellitus without complications: Secondary | ICD-10-CM | POA: Diagnosis present

## 2017-11-04 DIAGNOSIS — K219 Gastro-esophageal reflux disease without esophagitis: Secondary | ICD-10-CM | POA: Diagnosis present

## 2017-11-04 DIAGNOSIS — N400 Enlarged prostate without lower urinary tract symptoms: Secondary | ICD-10-CM | POA: Diagnosis present

## 2017-11-04 DIAGNOSIS — W1789XA Other fall from one level to another, initial encounter: Secondary | ICD-10-CM | POA: Diagnosis present

## 2017-11-04 LAB — GLUCOSE, CAPILLARY
GLUCOSE-CAPILLARY: 226 mg/dL — AB (ref 70–99)
GLUCOSE-CAPILLARY: 313 mg/dL — AB (ref 70–99)
Glucose-Capillary: 177 mg/dL — ABNORMAL HIGH (ref 70–99)
Glucose-Capillary: 204 mg/dL — ABNORMAL HIGH (ref 70–99)

## 2017-11-04 LAB — BASIC METABOLIC PANEL
Anion gap: 10 (ref 5–15)
BUN: 20 mg/dL (ref 8–23)
CHLORIDE: 102 mmol/L (ref 98–111)
CO2: 25 mmol/L (ref 22–32)
CREATININE: 0.88 mg/dL (ref 0.61–1.24)
Calcium: 9.2 mg/dL (ref 8.9–10.3)
GFR calc Af Amer: >60 mL/min (ref >60)
GFR calc non Af Amer: >60 mL/min (ref >60)
GLUCOSE: 191 mg/dL — AB (ref 70–99)
Potassium: 3.4 mmol/L — ABNORMAL LOW (ref 3.5–5.1)
Sodium: 137 mmol/L (ref 135–145)

## 2017-11-04 LAB — CBC
HCT: 39.9 % (ref 39.0–52.0)
Hemoglobin: 13.2 g/dL (ref 13.0–17.0)
MCH: 30.4 pg (ref 26.0–34.0)
MCHC: 33.1 g/dL (ref 30.0–36.0)
MCV: 91.9 fL (ref 78.0–100.0)
PLATELETS: 162 10*3/uL (ref 150–400)
RBC: 4.34 MIL/uL (ref 4.22–5.81)
RDW: 14 % (ref 11.5–15.5)
WBC: 4.9 10*3/uL (ref 4.0–10.5)

## 2017-11-04 LAB — HIV ANTIBODY (ROUTINE TESTING W REFLEX): HIV Screen 4th Generation wRfx: NONREACTIVE

## 2017-11-04 MED ORDER — POTASSIUM CHLORIDE CRYS ER 20 MEQ PO TBCR
40.0000 meq | EXTENDED_RELEASE_TABLET | Freq: Once | ORAL | Status: AC
  Administered 2017-11-04: 40 meq via ORAL
  Filled 2017-11-04: qty 2

## 2017-11-04 MED ORDER — VANCOMYCIN HCL 10 G IV SOLR
1250.0000 mg | Freq: Two times a day (BID) | INTRAVENOUS | Status: DC
  Administered 2017-11-04 – 2017-11-07 (×5): 1250 mg via INTRAVENOUS
  Filled 2017-11-04 (×7): qty 1250

## 2017-11-04 MED ORDER — INSULIN ASPART 100 UNIT/ML ~~LOC~~ SOLN
0.0000 [IU] | Freq: Three times a day (TID) | SUBCUTANEOUS | Status: DC
  Administered 2017-11-06: 15 [IU] via SUBCUTANEOUS
  Administered 2017-11-06: 2 [IU] via SUBCUTANEOUS
  Administered 2017-11-07: 3 [IU] via SUBCUTANEOUS
  Administered 2017-11-07: 5 [IU] via SUBCUTANEOUS

## 2017-11-04 NOTE — Progress Notes (Signed)
Patient arrives to room 5N05 at this time via wheelchair.  Independent to bed.  Alert and oriented

## 2017-11-04 NOTE — Plan of Care (Signed)
Pt alert and oriented. Verbalized understanding plan of care. Pt aware of symptoms suggesting complications from infection. Medications being used to manage symptoms. Pt progressing towards discharge.

## 2017-11-04 NOTE — Plan of Care (Signed)
Patient's blood sugar is 313 tonight, asymptomatic hyperglycemia, refusing insulin.  To follow and recheck blood sugars

## 2017-11-04 NOTE — Plan of Care (Signed)
  Problem: Skin Integrity: Goal: Skin integrity will improve Outcome: Progressing   Problem: Education: Goal: Knowledge of General Education information will improve Description: Including pain rating scale, medication(s)/side effects and non-pharmacologic comfort measures Outcome: Progressing   Problem: Health Behavior/Discharge Planning: Goal: Ability to manage health-related needs will improve Outcome: Progressing   Problem: Clinical Measurements: Goal: Ability to maintain clinical measurements within normal limits will improve Outcome: Progressing Goal: Will remain free from infection Outcome: Progressing Goal: Diagnostic test results will improve Outcome: Progressing Goal: Respiratory complications will improve Outcome: Progressing Goal: Cardiovascular complication will be avoided Outcome: Progressing   Problem: Activity: Goal: Risk for activity intolerance will decrease Outcome: Progressing   Problem: Nutrition: Goal: Adequate nutrition will be maintained Outcome: Progressing   Problem: Coping: Goal: Level of anxiety will decrease Outcome: Progressing   Problem: Elimination: Goal: Will not experience complications related to bowel motility Outcome: Progressing Goal: Will not experience complications related to urinary retention Outcome: Progressing   Problem: Pain Managment: Goal: General experience of comfort will improve Outcome: Progressing   Problem: Safety: Goal: Ability to remain free from injury will improve Outcome: Progressing   Problem: Skin Integrity: Goal: Risk for impaired skin integrity will decrease Outcome: Progressing   

## 2017-11-04 NOTE — Progress Notes (Signed)
Pharmacy Antibiotic Note  Adam Cardenas is a 67 y.o. male admitted on 11/03/2017 with cellulitis.  Pharmacy has been consulted for Vancomycin dosing.  SCr has improved to 0.88 << 1.11, CrCl~100 ml/min. Will adjust the Vancomycin dose slightly today.  Plan: - Adjust Vancomycin to 1250 mg IV every 12 hours - Will continue to follow renal function, culture results, LOT, and antibiotic de-escalation plans   Height: 6\' 1"  (185.4 cm) Weight: 220 lb (99.8 kg) IBW/kg (Calculated) : 79.9  Temp (24hrs), Avg:98.4 F (36.9 C), Min:98 F (36.7 C), Max:98.9 F (37.2 C)  Recent Labs  Lab 11/03/17 1519 11/03/17 1542 11/04/17 0356  WBC 7.4  --  4.9  CREATININE 1.11  --  0.88  LATICACIDVEN  --  1.75  --     Estimated Creatinine Clearance: 99.9 mL/min (by C-G formula based on SCr of 0.88 mg/dL).    Allergies  Allergen Reactions  . 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 Sitagliptin Other (See Comments)    Pancreatitis     Antimicrobials this admission: Vanc 9/26 >>  Dose adjustments this admission:   Microbiology results: 9/26 BCx >> ngtd  Thank you for allowing pharmacy to be a part of this patient's care.  Georgina Pillion, PharmD, BCPS Clinical Pharmacist Pager: [phone] Clinical phone for 11/04/2017 from 7a-3:30p: [phone] If after 3:30p, please call main pharmacy at: [phone] Please check AMION for all [facility] numbers 11/04/2017 9:55 AM

## 2017-11-04 NOTE — Progress Notes (Addendum)
[facility] PROGRESS NOTE  JAYKWON MORONES ZOX:[id number] DOB: 1951-01-12 DOA: 11/03/2017 PCP: Carlos Levering, PA-C  Adam Cardenas is a 67 y.o. male with medical history significant of hypertension, hyperlipidemia, diabetes mellitus, GERD, BPH, OSA on CPAP, who presents with left leg pain.  Patient states that he fell off a tractor trailer on Saturday and bumped his left lower leg. No head or neck injury. No LOC. Initially he just noted contusion and skin abrasion in the left mid shin area, without penetration wound in the leg.  He states that the pain has worsened since 2 days ago. He developed erythema and warmth in the left mid shin area, which has been spreading down to the lateral side of left foot.     Assessment/Plan: Cellulitis of left leg: Patient failed outpatient oral antibiotic treatment with Keflex and clindamycin. LE Dopplers negative for DVT.  No bony fracture on x-ray. Leg remains warm, red very tender. No streaking. Afebrile and non-toxic appearing -vancomycin  - PRN Zofran for nausea, morphine and Percocet for pain - Blood cultures with no growth to date - ESR within limits of normal but CRP elevated -area marked  Diabetes mellitus without complication ([place]): poor control. Last A1c 8.5 on 09/27/17. Home meds include glyburide, Jardiance, metformin all of which are on hold.  -will change SSI to moderate -request diabetes coordinator for evaluation and recommendations  HYPERCHOLESTEROLEMIA: -zocor  Obstructive sleep apnea -CPAP  Essential hypertension: -fair control -Continue home medications: Felodipine, Prinzide  GERD: -Protonix  BPH: stable - Continue Avodart and Proscar  Alcohol use: Patient states that he drinks 2-3 times per week, several ounce of bourbon each time, last drink was last night, 4 ounce of bourbon. Currently no signs of withdrawal.  -CiWA protocol  Patient being changed to inpatient, failed outpatient abx and is a risk for  complications due to poorly controlled DM   Code Status: full Family Communication: none present) Disposition Plan: home once improved   Consultants:  none  Procedures:  dopplar LE   Antibiotics:  Vancomycin 9/26>>  HPI/Subjective: Patient states that he fell off a tractor trailer and bumped his left lower leg. No head or neck injury. No LOC. Initially he just noted contusion and skin abrasion in the left mid shin area, without penetration wound in the leg.  He stated that the pain worsened. He developed erythema and warmth in the left mid shin area, which had been spreading down to the lateral side of left foot.  The pain described as constant, can reach 10 out of 10 severity, sharp, nonradiating.  Denied any fever or chills.  Patient was seen in urgent care, and put on Keflex two days. without improvement, then switched to clindamycin. Still no improvement.  His pain has been persistent, therefore came back to emergency room for further evaluation and treatment. Objective: Vitals:   11/04/17 0622 11/04/17 1206  BP: 138/73 135/66  Pulse: 79 79  Resp: 17 18  Temp: 98.4 F (36.9 C) 98.4 F (36.9 C)  SpO2: 96% 94%   No intake or output data in the 24 hours ending 11/04/17 1324 Filed Weights   11/03/17 1417  Weight: 99.8 kg    Exam:   General:  Awake alert sittingup in bed eating breakfast  Cardiovascular: RRR no mgr left leg with erythema, swelling, heat tenderness and 1+ edema from shin to foot. Mid shin lateral aspect with hematoma and small pustule in middle. No drainage no odor- foot markedly swollen and tender  Respiratory:  normal effort BS clear bilaterally no wheeze/rhonchi  Abdomen: non-distended soft +BS no guarding or rebounding  Musculoskeletal: see above. Joints without swelling/erythema   Data Reviewed: Basic Metabolic Panel: Recent Labs  Lab 11/03/17 1519 11/04/17 0356  NA 137 137  K 4.1 3.4*  CL 97* 102  CO2 26 25  GLUCOSE 306* 191*  BUN 21  20  CREATININE 1.11 0.88  CALCIUM 10.2 9.2   Liver Function Tests: Recent Labs  Lab 11/03/17 1519  AST 32  ALT 43  ALKPHOS 87  BILITOT 0.9  PROT 7.5  ALBUMIN 4.5   No results for input(s): LIPASE, AMYLASE in the last 168 hours. No results for input(s): AMMONIA in the last 168 hours. CBC: Recent Labs  Lab 11/03/17 1519 11/04/17 0356  WBC 7.4 4.9  NEUTROABS 5.4  --   HGB 15.1 13.2  HCT 46.0 39.9  MCV 93.1 91.9  PLT 195 162   Cardiac Enzymes: No results for input(s): CKTOTAL, CKMB, CKMBINDEX, TROPONINI in the last 168 hours. BNP (last 3 results) No results for input(s): BNP in the last 8760 hours.  ProBNP (last 3 results) No results for input(s): PROBNP in the last 8760 hours.  CBG: Recent Labs  Lab 11/03/17 2215 11/04/17 0722 11/04/17 1204  GLUCAP 175* 177* 226*    Recent Results (from the past 240 hour(s))  Culture, blood (routine x 2)     Status: None (Preliminary result)   Collection Time: 11/03/17  3:21 PM  Result Value Ref Range Status   Specimen Description BLOOD LEFT ANTECUBITAL  Final   Special Requests   Final    BOTTLES DRAWN AEROBIC AND ANAEROBIC Blood Culture adequate volume   Culture   Final    NO GROWTH < 24 HOURS Performed at [facility], [place] [address] [place], [place] [address]    Report Status PENDING  Incomplete  Culture, blood (routine x 2)     Status: None (Preliminary result)   Collection Time: 11/03/17  9:00 PM  Result Value Ref Range Status   Specimen Description BLOOD BLOOD RIGHT FOREARM  Final   Special Requests   Final    BOTTLES DRAWN AEROBIC AND ANAEROBIC Blood Culture adequate volume   Culture   Final    NO GROWTH < 12 HOURS Performed at [facility], [place] [address], [place], [place] [address]    Report Status PENDING  Incomplete     Studies: Dg Tibia/fibula Left  Result Date: 11/03/2017 CLINICAL DATA:  Status post fall with left leg pain. EXAM: LEFT TIBIA AND FIBULA - 2 VIEW COMPARISON:  None.  FINDINGS: There is no evidence of fracture or other focal bone lesions. Soft tissues are unremarkable. IMPRESSION: Negative. Electronically Signed   By: Abelardo Diesel M.D.   On: 11/03/2017 20:40   Dg Ankle 2 Views Left  Result Date: 11/03/2017 CLINICAL DATA:  Status post fall with left ankle pain. EXAM: LEFT ANKLE - 2 VIEW COMPARISON:  None. FINDINGS: There is no evidence of fracture, dislocation, or joint effusion. There is soft tissue swelling around the ankle. IMPRESSION: No acute fracture or dislocation. Electronically Signed   By: Abelardo Diesel M.D.   On: 11/03/2017 20:41   Dg Foot 2 Views Left  Result Date: 11/03/2017 CLINICAL DATA:  Status post fall with left foot pain. EXAM: LEFT FOOT - 2 VIEW COMPARISON:  None. FINDINGS: There is no evidence of fracture or dislocation. Soft tissues are unremarkable. IMPRESSION: No acute fracture or dislocation. Electronically Signed  By: Abelardo Diesel M.D.   On: 11/03/2017 20:39    Scheduled Meds: . bromocriptine  5 mg Oral Daily  . enoxaparin (LOVENOX) injection  40 mg Subcutaneous Q24H  . felodipine  5 mg Oral Daily  . finasteride  5 mg Oral Daily  . folic acid  1 mg Oral Daily  . hydrochlorothiazide  50 mg Oral Daily  . insulin aspart  0-5 Units Subcutaneous QHS  . insulin aspart  0-9 Units Subcutaneous TID WC  . lisinopril  40 mg Oral Daily  . LORazepam  0-4 mg Intravenous Q6H   Followed by  . [START ON 11/05/2017] LORazepam  0-4 mg Intravenous Q12H  . multivitamin with minerals  1 tablet Oral Daily  . pantoprazole  40 mg Oral Daily  . simvastatin  40 mg Oral QHS  . thiamine  100 mg Oral Daily   Or  . thiamine  100 mg Intravenous Daily   Continuous Infusions: . vancomycin      Principal Problem:   Cellulitis of left leg Active Problems:   Diabetes mellitus without complication (HCC)   HYPERCHOLESTEROLEMIA   Obstructive sleep apnea   HTN (hypertension)   Alcohol use   BPH (benign prostatic hyperplasia)   GERD (gastroesophageal  reflux disease)    Time spent: 35 minutes    Eulogio Bear DO [facility] If 7PM-7AM, please contact night-coverage at [email or web address], password [facility] 11/04/2017, 1:24 PM  LOS: 0 days

## 2017-11-04 NOTE — Progress Notes (Signed)
Inpatient Diabetes Program Recommendations  AACE/ADA: New Consensus Statement on Inpatient Glycemic Control (2015)  Target Ranges:  Prepandial:   less than 140 mg/dL      Peak postprandial:   less than 180 mg/dL (1-2 hours)      Critically ill patients:  140 - 180 mg/dL   Lab Results  Component Value Date   GLUCAP 226 (H) 11/04/2017   HGBA1C 8.5 (A) 09/27/2017    Review of Glycemic Control  Diabetes history: DM2 Outpatient Diabetes medications: Jardiance 25 mg QD, glyburide 5 mg QD, metformin 2000 mg QHS Current orders for Inpatient glycemic control: Novolog 0-15 units tidwc and hs  HgbA1C - 8.5%. 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  Spoke with pt about his HgbA1C of 8.5%. Pt states it has been much higher and is steadily coming down. Checks blood sugars several times/week. 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 Sees endo on regular basis.  Post-prandials > 180 mg/dL.  Inpatient Diabetes Program Recommendations:     Add Novolog 3 units tidwc for meal coverage insulin while inpatient. Resume OHAs at discharge.   Appears to be knowledgeable regarding his diabetes and watches his diet most of the time. Very little exercise since truck driver. Discussed importance of continuing to reduce HgbA1C and improve glycemic control.  Will follow while inpatient.  Thank you. Ailene Ards, RD, LDN, CDE Inpatient Diabetes Coordinator [phone]

## 2017-11-04 NOTE — Progress Notes (Signed)
RT place patient on CPAP HS. NO O2 bleed in needed. Patient tolerating well at this time.

## 2017-11-05 LAB — BASIC METABOLIC PANEL
ANION GAP: 11 (ref 5–15)
BUN: 22 mg/dL (ref 8–23)
CALCIUM: 9.5 mg/dL (ref 8.9–10.3)
CO2: 24 mmol/L (ref 22–32)
Chloride: 101 mmol/L (ref 98–111)
Creatinine, Ser: 0.96 mg/dL (ref 0.61–1.24)
GFR calc Af Amer: >60 mL/min (ref >60)
GFR calc non Af Amer: >60 mL/min (ref >60)
Glucose, Bld: 219 mg/dL — ABNORMAL HIGH (ref 70–99)
POTASSIUM: 3.8 mmol/L (ref 3.5–5.1)
SODIUM: 136 mmol/L (ref 135–145)

## 2017-11-05 LAB — GLUCOSE, CAPILLARY
GLUCOSE-CAPILLARY: 261 mg/dL — AB (ref 70–99)
GLUCOSE-CAPILLARY: 241 mg/dL — AB (ref 70–99)
Glucose-Capillary: 206 mg/dL — ABNORMAL HIGH (ref 70–99)
Glucose-Capillary: 219 mg/dL — ABNORMAL HIGH (ref 70–99)

## 2017-11-05 LAB — CBC
HCT: 42 % (ref 39.0–52.0)
Hemoglobin: 13.8 g/dL (ref 13.0–17.0)
MCH: 30.3 pg (ref 26.0–34.0)
MCHC: 32.9 g/dL (ref 30.0–36.0)
MCV: 92.3 fL (ref 78.0–100.0)
Platelets: 175 10*3/uL (ref 150–400)
RBC: 4.55 MIL/uL (ref 4.22–5.81)
RDW: 14 % (ref 11.5–15.5)
WBC: 5.2 10*3/uL (ref 4.0–10.5)

## 2017-11-05 NOTE — Progress Notes (Signed)
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 CBG checked as scheduled. MD notified.

## 2017-11-05 NOTE — Consult Note (Addendum)
ORTHOPAEDIC CONSULTATION  REQUESTING PHYSICIAN: Elgergawy, Leana Roe, MD  Chief Complaint: Left leg pain  HPI: Adam Cardenas is a 67 y.o. male who complains of one week of left anterior tibial pain since Saturday 10/29/17 when he fell from a tractor.  He suffered a glancing blow to the area which resulted in a significant hematoma and superficial abrasion but no puncture or open wound.  Since the event he has had increasing pain and swelling in the area with surrounding erythema and warmth extending down to his foot.  He was initially placed on Keflex then clindamycin without significant improvement.  He presented to the emergency department where he was admitted for IV antibiotic therapy.  Since his admission he notes significant improvement in his foot swelling and erythema.  The swollen area of his anterior medial tibia has persisted and causes pain with ambulation and dependency.  Before presenting to the emergency department he complained of some malaise but denies fever, chills, nausea or systemic symptoms.  X-rays of the area were negative for fracture, dislocation, or other abnormality.  Ultrasound was negative for DVT.  Orthopedics was consulted to evaluate for possible abscess.  The patient has history of diabetes with improving but still relatively poor control-last A1c 8.5.  He quit smoking in 2000.  He denies history of MI, CVA, DVT, or PE.    Past Medical History:  Diagnosis Date  . Cellulitis   . Diabetes mellitus   . HTN (hypertension)   . Hypercholesterolemia   . Seasonal allergic rhinitis   . Sleep apnea    Past Surgical History:  Procedure Laterality Date  . repair deviated septum     Social History   Socioeconomic History  . Marital status: Unknown    Spouse name: Not on file  . Number of children: Not on file  . Years of education: Not on file  . Highest education level: Not on file  Occupational History  . Occupation: truck Hospital doctor - long haul  Social  Needs  . Financial resource strain: Not on file  . Food insecurity:    Worry: Not on file    Inability: Not on file  . Transportation needs:    Medical: Not on file    Non-medical: Not on file  Tobacco Use  . Smoking status: Former Smoker    Packs/day: 1.00    Years: 30.00    Pack years: 30.00    Types: Cigarettes    Last attempt to quit: 02/08/1998    Years since quitting: 19.7  . Smokeless tobacco: Never Used  Substance and Sexual Activity  . Alcohol use: Yes    Comment: rare-liquor  . Drug use: No  . Sexual activity: Not on file  Lifestyle  . Physical activity:    Days per week: Not on file    Minutes per session: Not on file  . Stress: Not on file  Relationships  . Social connections:    Talks on phone: Not on file    Gets together: Not on file    Attends religious service: Not on file    Active member of club or organization: Not on file    Attends meetings of clubs or organizations: Not on file    Relationship status: Not on file  Other Topics Concern  . Not on file  Social History Narrative  . Not on file   Family History  Problem Relation Age of Onset  . Cancer Father   . Diabetes  Maternal Grandfather    Allergies  Allergen Reactions  . 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 Sitagliptin Other (See Comments)    Pancreatitis    Prior to Admission medications   Medication Sig Start Date End Date Taking? Authorizing Provider  bromocriptine (PARLODEL) 5 MG capsule Take 1 capsule (5 mg total) by mouth daily. 09/27/17  Yes Romero Belling, MD  clindamycin (CLEOCIN) 300 MG capsule Take 300 mg by mouth 3 (three) times daily. FOR 10 DAYS 11/03/17 11/11/17 Yes [provider]  dutasteride (AVODART) 0.5 MG capsule Take 0.5 mg by mouth daily.     Yes [provider]  empagliflozin (JARDIANCE) 25 MG TABS tablet Take 25 mg by mouth daily. 06/20/17  Yes Romero Belling, MD  felodipine  (PLENDIL) 5 MG 24 hr tablet Take 5 mg by mouth daily.   Yes [provider]  finasteride (PROSCAR) 5 MG tablet Take 5 mg by mouth daily. 10/01/17  Yes [provider]  glyBURIDE (DIABETA) 5 MG tablet Take 1 tablet (5 mg total) by mouth daily with breakfast. 09/27/17  Yes Romero Belling, MD  lisinopril-hydrochlorothiazide (PRINZIDE,ZESTORETIC) 20-25 MG tablet Take 2 tablets by mouth daily.    Yes [provider]  metFORMIN (GLUCOPHAGE-XR) 500 MG 24 hr tablet Take 4 tablets (2,000 mg total) by mouth daily. Patient taking differently: Take 2,000 mg by mouth every evening.  04/22/17  Yes Romero Belling, MD  omeprazole (PRILOSEC) 20 MG capsule Take 20 mg by mouth daily. 10/02/17  Yes [provider]  simvastatin (ZOCOR) 40 MG tablet Take 40 mg by mouth at bedtime.     Yes [provider]   Dg Tibia/fibula Left  Result Date: 11/03/2017 CLINICAL DATA:  Status post fall with left leg pain. EXAM: LEFT TIBIA AND FIBULA - 2 VIEW COMPARISON:  None. FINDINGS: There is no evidence of fracture or other focal bone lesions. Soft tissues are unremarkable. IMPRESSION: Negative. Electronically Signed   By: Sherian Rein M.D.   On: 11/03/2017 20:40   Dg Ankle 2 Views Left  Result Date: 11/03/2017 CLINICAL DATA:  Status post fall with left ankle pain. EXAM: LEFT ANKLE - 2 VIEW COMPARISON:  None. FINDINGS: There is no evidence of fracture, dislocation, or joint effusion. There is soft tissue swelling around the ankle. IMPRESSION: No acute fracture or dislocation. Electronically Signed   By: Sherian Rein M.D.   On: 11/03/2017 20:41   Dg Foot 2 Views Left  Result Date: 11/03/2017 CLINICAL DATA:  Status post fall with left foot pain. EXAM: LEFT FOOT - 2 VIEW COMPARISON:  None. FINDINGS: There is no evidence of fracture or dislocation. Soft tissues are unremarkable. IMPRESSION: No acute fracture or dislocation. Electronically Signed   By: Sherian Rein M.D.   On: 11/03/2017 20:39     Positive ROS: All other systems have been reviewed and were otherwise negative with the exception of those mentioned in the HPI and as above.  Objective: Labs cbc Recent Labs    11/04/17 0356 11/05/17 0320  WBC 4.9 5.2  HGB 13.2 13.8  HCT 39.9 42.0  PLT 162 175    Labs inflam Recent Labs    11/03/17 2150  CRP 2.9*    Labs coag No results for input(s): INR, PTT in the last 72 hours.  Invalid input(s): PT  Recent Labs    11/04/17 0356 11/05/17 0320  NA 137 136  K  3.4* 3.8  CL 102 101  CO2 25 24  GLUCOSE 191* 219*  BUN 20 22  CREATININE 0.88 0.96  CALCIUM 9.2 9.5    Physical Exam: Vitals:   11/05/17 0600 11/05/17 0937  BP: 132/70 (!) 167/78  Pulse:  78  Resp:  16  Temp:  98.1 F (36.7 C)  SpO2:  96%   General: Alert, well-appearing in no acute distress.  Upright in bed.  Calm, conversant.   Mental status: Alert and Oriented x3 Neurologic: Speech Clear and organized, no gross focal findings or movement disorder appreciated. Respiratory: No cyanosis, no use of accessory musculature Cardiovascular: No pedal edema GI: Abdomen is soft and non-tender, non-distended. Skin: Warm and dry.   Extremities: Warm, move independently Psychiatric: Patient is competent for consent with normal mood and affect  MUSCULOSKELETAL:  LLE: Left mid pretibial area with an approximately 7 x 7 cm raised, boggy / slightly fluctuant, erythematous area.  Overlying skin is dry without lesion.  There is no drainage.  Surrounding redness seems to have receded some from the lines drawn yesterday.  Foot is without significant swelling but there is some redness in the lateral foot.  Dorsiflexion, plantarflexion, inversion, eversion are intact and without significant pain.  There is no significant pain with passive range of motion either.  He has mild tenderness diffusely, anteriorly about the ankle joint. Other extremities are atraumatic with painless ROM and NVI.  Assessment /  Plan: Principal Problem:   Cellulitis of left leg Active Problems:   Diabetes mellitus without complication (HCC)   HYPERCHOLESTEROLEMIA   Obstructive sleep apnea   HTN (hypertension)   Alcohol use   BPH (benign prostatic hyperplasia)   GERD (gastroesophageal reflux disease)   Cellulitis   Left lower extremity hematoma, cellulitis -This appears to be improving some with antibiotic therapy, but a pretibial hematoma/fluid collection remains.  We cannot completely rule out infected hematoma given cellulitis.   -Also, persistent hematoma in this pretibial area may increase the chance of skin necrosis. -He is not toxic, does not have systemic symptoms, Gobin count is normal, and blood culture so far is negative.  Emergent surgery not needed.  -We will post the patient for I&D for tomorrow a.m. but will reassess prior to surgery and allow IV antibiotic therapy and compression to work.  -Elevate, compression, continue IV antibiotic therapy. -Continue DM management.  The details of above, risks and benefits of surgery versus nonoperative management were discussed at length the patient.  He verbalizes understanding and agrees with this plan.   Contact information:  Margarita Rana MD, Aquilla Hacker PA-C  Albina Billet III PA-C 11/05/2017 1:57 PM

## 2017-11-05 NOTE — Plan of Care (Signed)
  Problem: Education: Goal: Knowledge of General Education information will improve Description Including pain rating scale, medication(s)/side effects and non-pharmacologic comfort measures Outcome: Progressing   

## 2017-11-05 NOTE — Plan of Care (Signed)

## 2017-11-05 NOTE — Progress Notes (Signed)
[facility] PROGRESS NOTE  Adam Cardenas RDE:[id number] DOB: Aug 02, 1950 DOA: 11/03/2017 PCP: Carlos Levering, PA-C   Brief narrative:  Adam Cardenas is a 67 y.o. male with medical history significant of hypertension, hyperlipidemia, diabetes mellitus, GERD, BPH, OSA on CPAP, who presents with left leg pain.  Patient states that he fell off a tractor trailer on Saturday and bumped his left lower leg. No head or neck injury. No LOC. Initially he just noted contusion and skin abrasion in the left mid shin area, without penetration wound in the leg.  He states that the pain has worsened since 2 days ago. He developed erythema and warmth in the left mid shin area, which has been spreading down to the lateral side of left foot.     Assessment/Plan:  Cellulitis of left leg: -Patient failed outpatient oral antibiotic therapy, he was treated with Keflex and clindamycin, afebrile, nontoxic, nonseptic on admission. -Lower extremity Doppler negative for DVT, he does have in the medial side of the cellulitis, I have requested Ortho to evaluate to rule out abscess. -10 you with IV vancomycin, follow blood cultures  Patient failed outpatient oral antibiotic treatment with Keflex and clindamycin. LE Dopplers negative for DVT.  No bony  - ESR within limits of normal but CRP elevated -area marked  Diabetes mellitus without complication ([facility]): -  poor control. Last A1c 8.5 on 09/27/17. Home meds include glyburide, Jardiance, metformin all of which are on hold.  -will change SSI to moderate -request diabetes coordinator for evaluation and recommendations  HYPERCHOLESTEROLEMIA: -zocor  Obstructive sleep apnea -CPAP  Essential hypertension: -fair control -Continue home medications: Felodipine, Prinzide  GERD: -Protonix  BPH: stable - Continue Avodart and Proscar  Alcohol use: Patient states that he drinks 2-3 times per week, several ounce of bourbon each time, last drink was last  night, 4 ounce of bourbon. Currently no signs of withdrawal.  -CiWA protocol   Code Status: full Family Communication: none present) Disposition Plan: home once improved   Consultants:  none  Procedures:  dopplar LE   Antibiotics:  Vancomycin 9/26>> . Objective: Vitals:   11/05/17 0600 11/05/17 0937  BP: 132/70 (!) 167/78  Pulse:  78  Resp:  16  Temp:  98.1 F (36.7 C)  SpO2:  96%    Intake/Output Summary (Last 24 hours) at 11/05/2017 1223 Last data filed at 11/05/2017 1122 Gross per 24 hour  Intake -  Output 3270 ml  Net -3270 ml   Filed Weights   11/03/17 1417  Weight: 99.8 kg    Exam:  Awake Alert, Oriented X 3, No new F.N deficits, Normal affect Symmetrical Chest wall movement, Good air movement bilaterally, CTAB RRR,No Gallops,Rubs or new Murmurs, No Parasternal Heave +ve B.Sounds, Abd Soft, No tenderness, No rebound - guarding or rigidity. Left lower extremity cellulitis improving, some swelling in the medial side, most likely hematoma, but tender to palpation.   Data Reviewed: Basic Metabolic Panel: Recent Labs  Lab 11/03/17 1519 11/04/17 0356 11/05/17 0320  NA 137 137 136  K 4.1 3.4* 3.8  CL 97* 102 101  CO2 _0 GLUCOSE 306* 191* 219*  BUN _1 CREATININE 1.11 0.88 0.96  CALCIUM 10.2 9.2 9.5   Liver Function Tests: Recent Labs  Lab 11/03/17 1519  AST 32  ALT 43  ALKPHOS 87  BILITOT 0.9  PROT 7.5  ALBUMIN 4.5   No results for input(s): LIPASE, AMYLASE in the last 168 hours. No results for  input(s): AMMONIA in the last 168 hours. CBC: Recent Labs  Lab 11/03/17 1519 11/04/17 0356 11/05/17 0320  WBC 7.4 4.9 5.2  NEUTROABS 5.4  --   --   HGB 15.1 13.2 13.8  HCT 46.0 39.9 42.0  MCV 93.1 91.9 92.3  PLT 195 162 175   Cardiac Enzymes: No results for input(s): CKTOTAL, CKMB, CKMBINDEX, TROPONINI in the last 168 hours. BNP (last 3 results) No results for input(s): BNP in the last 8760 hours.  ProBNP (last 3  results) No results for input(s): PROBNP in the last 8760 hours.  CBG: Recent Labs  Lab 11/04/17 1204 11/04/17 1643 11/04/17 2055 11/05/17 0605 11/05/17 1138  GLUCAP 226* 204* 313* 206* 219*    Recent Results (from the past 240 hour(s))  Culture, blood (routine x 2)     Status: None (Preliminary result)   Collection Time: 11/03/17  3:21 PM  Result Value Ref Range Status   Specimen Description BLOOD LEFT ANTECUBITAL  Final   Special Requests   Final    BOTTLES DRAWN AEROBIC AND ANAEROBIC Blood Culture adequate volume   Culture   Final    NO GROWTH 2 DAYS Performed at [facility], [place] [address] [place], [place] [address]    Report Status PENDING  Incomplete  Culture, blood (routine x 2)     Status: None (Preliminary result)   Collection Time: 11/03/17  9:00 PM  Result Value Ref Range Status   Specimen Description BLOOD BLOOD RIGHT FOREARM  Final   Special Requests   Final    BOTTLES DRAWN AEROBIC AND ANAEROBIC Blood Culture adequate volume   Culture   Final    NO GROWTH 2 DAYS Performed at [facility], [place] [address] [place], [place] [address]    Report Status PENDING  Incomplete     Studies: Dg Tibia/fibula Left  Result Date: 11/03/2017 CLINICAL DATA:  Status post fall with left leg pain. EXAM: LEFT TIBIA AND FIBULA - 2 VIEW COMPARISON:  None. FINDINGS: There is no evidence of fracture or other focal bone lesions. Soft tissues are unremarkable. IMPRESSION: Negative. Electronically Signed   By: Abelardo Diesel M.D.   On: 11/03/2017 20:40   Dg Ankle 2 Views Left  Result Date: 11/03/2017 CLINICAL DATA:  Status post fall with left ankle pain. EXAM: LEFT ANKLE - 2 VIEW COMPARISON:  None. FINDINGS: There is no evidence of fracture, dislocation, or joint effusion. There is soft tissue swelling around the ankle. IMPRESSION: No acute fracture or dislocation. Electronically Signed   By: Abelardo Diesel M.D.   On: 11/03/2017 20:41   Dg Foot 2 Views Left  Result  Date: 11/03/2017 CLINICAL DATA:  Status post fall with left foot pain. EXAM: LEFT FOOT - 2 VIEW COMPARISON:  None. FINDINGS: There is no evidence of fracture or dislocation. Soft tissues are unremarkable. IMPRESSION: No acute fracture or dislocation. Electronically Signed   By: Abelardo Diesel M.D.   On: 11/03/2017 20:39    Scheduled Meds: . bromocriptine  5 mg Oral Daily  . enoxaparin (LOVENOX) injection  40 mg Subcutaneous Q24H  . felodipine  5 mg Oral Daily  . finasteride  5 mg Oral Daily  . folic acid  1 mg Oral Daily  . hydrochlorothiazide  50 mg Oral Daily  . insulin aspart  0-15 Units Subcutaneous TID WC  . insulin aspart  0-5 Units Subcutaneous QHS  . lisinopril  40 mg Oral Daily  . LORazepam  0-4 mg Intravenous  Q6H   Followed by  . LORazepam  0-4 mg Intravenous Q12H  . multivitamin with minerals  1 tablet Oral Daily  . pantoprazole  40 mg Oral Daily  . simvastatin  40 mg Oral QHS  . thiamine  100 mg Oral Daily   Or  . thiamine  100 mg Intravenous Daily   Continuous Infusions: . vancomycin 1,250 mg (11/05/17 0942)    Principal Problem:   Cellulitis of left leg Active Problems:   Diabetes mellitus without complication (H[facility])   HYPERCHOLESTEROLEMIA   Obstructive sleep apnea   HTN (hypertension)   Alcohol use   BPH (benign prostatic hyperplasia)   GERD (gastroesophageal reflux disease)   Cellulitis     Phillips Climes MD Pager [phone] [facility] If 7PM-7AM, please contact night-coverage at [email or web address], password [facility] 11/05/2017, 12:23 PM  LOS: 1 day

## 2017-11-06 ENCOUNTER — Encounter (HOSPITAL_COMMUNITY)
Admission: EM | Disposition: A | Discharge status: Home / Self Care | Payer: Self-pay | Source: Home / Self Care | Attending: Internal Medicine

## 2017-11-06 ENCOUNTER — Inpatient Hospital Stay (HOSPITAL_COMMUNITY): Payer: No Typology Code available for payment source | Admitting: Anesthesiology

## 2017-11-06 DIAGNOSIS — N400 Enlarged prostate without lower urinary tract symptoms: Secondary | ICD-10-CM

## 2017-11-06 HISTORY — PX: I & D EXTREMITY: SHX5045

## 2017-11-06 LAB — CBC
HCT: 45 % (ref 39.0–52.0)
Hemoglobin: 14.8 g/dL (ref 13.0–17.0)
MCH: 30.3 pg (ref 26.0–34.0)
MCHC: 32.9 g/dL (ref 30.0–36.0)
MCV: 92.2 fL (ref 78.0–100.0)
PLATELETS: 191 10*3/uL (ref 150–400)
RBC: 4.88 MIL/uL (ref 4.22–5.81)
RDW: 13.8 % (ref 11.5–15.5)
WBC: 6.6 10*3/uL (ref 4.0–10.5)

## 2017-11-06 LAB — BASIC METABOLIC PANEL
ANION GAP: 12 (ref 5–15)
BUN: 23 mg/dL (ref 8–23)
CALCIUM: 9.4 mg/dL (ref 8.9–10.3)
CO2: 23 mmol/L (ref 22–32)
Chloride: 98 mmol/L (ref 98–111)
Creatinine, Ser: 1.05 mg/dL (ref 0.61–1.24)
Glucose, Bld: 225 mg/dL — ABNORMAL HIGH (ref 70–99)
Potassium: 3.9 mmol/L (ref 3.5–5.1)
Sodium: 133 mmol/L — ABNORMAL LOW (ref 135–145)

## 2017-11-06 LAB — GLUCOSE, CAPILLARY
GLUCOSE-CAPILLARY: 221 mg/dL — AB (ref 70–99)
GLUCOSE-CAPILLARY: 166 mg/dL — AB (ref 70–99)
GLUCOSE-CAPILLARY: 252 mg/dL — AB (ref 70–99)
Glucose-Capillary: 216 mg/dL — ABNORMAL HIGH (ref 70–99)
Glucose-Capillary: 358 mg/dL — ABNORMAL HIGH (ref 70–99)

## 2017-11-06 LAB — MRSA PCR SCREENING: MRSA BY PCR: NEGATIVE

## 2017-11-06 SURGERY — IRRIGATION AND DEBRIDEMENT EXTREMITY
Anesthesia: General | Laterality: Left

## 2017-11-06 MED ORDER — OXYCODONE HCL 5 MG PO TABS: 5.0000 mg | ORAL_TABLET | ORAL | Status: DC | PRN

## 2017-11-06 MED ORDER — LIDOCAINE 2% (20 MG/ML) 5 ML SYRINGE
INTRAMUSCULAR | Status: DC | PRN
  Administered 2017-11-06: 100 mg via INTRAVENOUS

## 2017-11-06 MED ORDER — DIPHENHYDRAMINE HCL 50 MG/ML IJ SOLN
INTRAMUSCULAR | Status: DC | PRN
  Administered 2017-11-06: 6.25 mg via INTRAVENOUS

## 2017-11-06 MED ORDER — KETOROLAC TROMETHAMINE 15 MG/ML IJ SOLN
INTRAMUSCULAR | Status: AC
  Filled 2017-11-06: qty 1

## 2017-11-06 MED ORDER — PROMETHAZINE HCL 25 MG/ML IJ SOLN: 6.2500 mg | INTRAMUSCULAR | Status: DC | PRN

## 2017-11-06 MED ORDER — PROPOFOL 10 MG/ML IV BOLUS
INTRAVENOUS | Status: DC | PRN
  Administered 2017-11-06: 150 mg via INTRAVENOUS

## 2017-11-06 MED ORDER — FENTANYL CITRATE (PF) 100 MCG/2ML IJ SOLN
INTRAMUSCULAR | Status: DC | PRN
  Administered 2017-11-06: 100 ug via INTRAVENOUS

## 2017-11-06 MED ORDER — GLYBURIDE 5 MG PO TABS: 5.0000 mg | ORAL_TABLET | Freq: Every day | ORAL | Status: DC

## 2017-11-06 MED ORDER — HYDROCODONE-ACETAMINOPHEN 5-325 MG PO TABS: 1.0000 | ORAL_TABLET | Freq: Three times a day (TID) | ORAL | 0 refills | Status: AC | PRN

## 2017-11-06 MED ORDER — PHENYLEPHRINE 40 MCG/ML (10ML) SYRINGE FOR IV PUSH (FOR BLOOD PRESSURE SUPPORT)
PREFILLED_SYRINGE | INTRAVENOUS | Status: DC | PRN
  Administered 2017-11-06: 80 ug via INTRAVENOUS
  Administered 2017-11-06: 120 ug via INTRAVENOUS
  Administered 2017-11-06: 80 ug via INTRAVENOUS

## 2017-11-06 MED ORDER — PROPOFOL 10 MG/ML IV BOLUS
INTRAVENOUS | Status: AC
  Filled 2017-11-06: qty 20

## 2017-11-06 MED ORDER — MORPHINE SULFATE (PF) 2 MG/ML IV SOLN: 1.0000 mg | INTRAVENOUS | Status: DC | PRN

## 2017-11-06 MED ORDER — ACETAMINOPHEN 500 MG PO TABS
ORAL_TABLET | ORAL | Status: AC
  Filled 2017-11-06: qty 1

## 2017-11-06 MED ORDER — HYDROMORPHONE HCL 1 MG/ML IJ SOLN
0.2500 mg | INTRAMUSCULAR | Status: DC | PRN
  Administered 2017-11-06 (×2): 0.5 mg via INTRAVENOUS

## 2017-11-06 MED ORDER — GLYBURIDE 5 MG PO TABS
5.0000 mg | ORAL_TABLET | Freq: Every day | ORAL | Status: DC
  Administered 2017-11-07: 5 mg via ORAL
  Filled 2017-11-06: qty 1

## 2017-11-06 MED ORDER — LACTATED RINGERS IV SOLN
INTRAVENOUS | Status: DC
  Administered 2017-11-06: 09:00:00 via INTRAVENOUS

## 2017-11-06 MED ORDER — HYDROMORPHONE HCL 1 MG/ML IJ SOLN
INTRAMUSCULAR | Status: AC
  Administered 2017-11-06: 0.5 mg via INTRAVENOUS
  Filled 2017-11-06: qty 1

## 2017-11-06 MED ORDER — EPHEDRINE SULFATE 50 MG/ML IJ SOLN
INTRAMUSCULAR | Status: DC | PRN
  Administered 2017-11-06: 10 mg via INTRAVENOUS

## 2017-11-06 MED ORDER — INSULIN ASPART 100 UNIT/ML ~~LOC~~ SOLN
SUBCUTANEOUS | Status: AC
  Administered 2017-11-06: 2 [IU] via SUBCUTANEOUS
  Filled 2017-11-06: qty 1

## 2017-11-06 MED ORDER — ACETAMINOPHEN 500 MG PO TABS
1000.0000 mg | ORAL_TABLET | Freq: Once | ORAL | Status: AC
  Administered 2017-11-06: 1000 mg via ORAL

## 2017-11-06 MED ORDER — MIDAZOLAM HCL 5 MG/5ML IJ SOLN
INTRAMUSCULAR | Status: DC | PRN
  Administered 2017-11-06: 1 mg via INTRAVENOUS

## 2017-11-06 MED ORDER — KETOROLAC TROMETHAMINE 15 MG/ML IJ SOLN
7.5000 mg | Freq: Four times a day (QID) | INTRAMUSCULAR | Status: AC
  Administered 2017-11-06 (×2): 7.5 mg via INTRAVENOUS
  Filled 2017-11-06: qty 1

## 2017-11-06 MED ORDER — MIDAZOLAM HCL 2 MG/2ML IJ SOLN
INTRAMUSCULAR | Status: AC
  Filled 2017-11-06: qty 2

## 2017-11-06 MED ORDER — 0.9 % SODIUM CHLORIDE (POUR BTL) OPTIME
TOPICAL | Status: DC | PRN
  Administered 2017-11-06: 1000 mL

## 2017-11-06 MED ORDER — ONDANSETRON HCL 4 MG/2ML IJ SOLN
INTRAMUSCULAR | Status: DC | PRN
  Administered 2017-11-06: 4 mg via INTRAVENOUS

## 2017-11-06 MED ORDER — CANAGLIFLOZIN 100 MG PO TABS
100.0000 mg | ORAL_TABLET | Freq: Every day | ORAL | Status: DC
  Administered 2017-11-07: 100 mg via ORAL
  Filled 2017-11-06: qty 1

## 2017-11-06 MED ORDER — CEFAZOLIN SODIUM-DEXTROSE 2-4 GM/100ML-% IV SOLN
2.0000 g | INTRAVENOUS | Status: AC
  Administered 2017-11-06: 2 g via INTRAVENOUS

## 2017-11-06 MED ORDER — CEFAZOLIN SODIUM-DEXTROSE 2-4 GM/100ML-% IV SOLN
INTRAVENOUS | Status: AC
  Filled 2017-11-06: qty 100

## 2017-11-06 MED ORDER — SODIUM CHLORIDE 0.9 % IR SOLN
Status: DC | PRN
  Administered 2017-11-06: 3000 mL

## 2017-11-06 MED ORDER — FENTANYL CITRATE (PF) 250 MCG/5ML IJ SOLN
INTRAMUSCULAR | Status: AC
  Filled 2017-11-06: qty 5

## 2017-11-06 MED ORDER — ACETAMINOPHEN 500 MG PO TABS
1000.0000 mg | ORAL_TABLET | Freq: Three times a day (TID) | ORAL | Status: DC
  Administered 2017-11-06 – 2017-11-07 (×3): 1000 mg via ORAL
  Filled 2017-11-06 (×3): qty 2

## 2017-11-06 MED ORDER — KETOROLAC TROMETHAMINE 15 MG/ML IJ SOLN: 15.0000 mg | Freq: Four times a day (QID) | INTRAMUSCULAR | Status: DC

## 2017-11-06 SURGICAL SUPPLY — 29 items
BANDAGE ACE 4X5 VEL STRL LF (GAUZE/BANDAGES/DRESSINGS) ×2 IMPLANT
BLADE SURG 10 STRL SS (BLADE) ×3 IMPLANT
COVER SURGICAL LIGHT HANDLE (MISCELLANEOUS) ×3 IMPLANT
CUFF TOURNIQUET SINGLE 34IN LL (TOURNIQUET CUFF) IMPLANT
DRAPE U-SHAPE 47X51 STRL (DRAPES) ×2 IMPLANT
DRSG ADAPTIC 3X8 NADH LF (GAUZE/BANDAGES/DRESSINGS) ×2 IMPLANT
DURAPREP 26ML APPLICATOR (WOUND CARE) ×5 IMPLANT
ELECT REM PT RETURN 9FT ADLT (ELECTROSURGICAL) ×3
ELECTRODE REM PT RTRN 9FT ADLT (ELECTROSURGICAL) IMPLANT
GLOVE BIOGEL PI IND STRL 8 (GLOVE) ×2 IMPLANT
GLOVE BIOGEL PI INDICATOR 8 (GLOVE) ×4
GOWN STRL REUS W/ TWL LRG LVL3 (GOWN DISPOSABLE) ×3 IMPLANT
GOWN STRL REUS W/TWL LRG LVL3 (GOWN DISPOSABLE) ×9
KIT BASIN OR (CUSTOM PROCEDURE TRAY) ×3 IMPLANT
KIT TURNOVER KIT B (KITS) ×3 IMPLANT
MANIFOLD NEPTUNE II (INSTRUMENTS) ×3 IMPLANT
NS IRRIG 1000ML POUR BTL (IV SOLUTION) ×3 IMPLANT
PACK ORTHO EXTREMITY (CUSTOM PROCEDURE TRAY) ×3 IMPLANT
PAD ABD 8X10 STRL (GAUZE/BANDAGES/DRESSINGS) ×2 IMPLANT
PAD ARMBOARD 7.5X6 YLW CONV (MISCELLANEOUS) ×6 IMPLANT
STOCKINETTE IMPERVIOUS 9X36 MD (GAUZE/BANDAGES/DRESSINGS) ×3 IMPLANT
SUT ETHILON 3 0 FSL (SUTURE) ×4 IMPLANT
TOWEL OR 17X24 6PK STRL BLUE (TOWEL DISPOSABLE) ×3 IMPLANT
TOWEL OR 17X26 10 PK STRL BLUE (TOWEL DISPOSABLE) ×3 IMPLANT
TUBE CONNECTING 12'X1/4 (SUCTIONS) ×1
TUBE CONNECTING 12X1/4 (SUCTIONS) ×2 IMPLANT
TUBING CYSTO DISP (UROLOGICAL SUPPLIES) ×2 IMPLANT
UNDERPAD 30X30 (UNDERPADS AND DIAPERS) ×3 IMPLANT
YANKAUER SUCT BULB TIP NO VENT (SUCTIONS) ×3 IMPLANT

## 2017-11-06 NOTE — Progress Notes (Signed)
[facility] PROGRESS NOTE  SAYF KERNER IRJ:[id number] DOB: 02-18-1950 DOA: 11/03/2017 PCP: Carilyn Goodpasture, PA-C   Brief narrative:  Adam Cardenas is a 67 y.o. male with medical history significant of hypertension, hyperlipidemia, diabetes mellitus, GERD, BPH, OSA on CPAP, who presents with left leg pain.  Patient states that he fell off a tractor trailer on Saturday and bumped his left lower leg. No head or neck injury. No LOC. Initially he just noted contusion and skin abrasion in the left mid shin area, without penetration wound in the leg.  He states that the pain has worsened since 2 days ago. He developed erythema and warmth in the left mid shin area, which has been spreading down to the lateral side of left foot.    Subjective  He denies any complaints today   Assessment/Plan:  Cellulitis of left leg: -Patient failed outpatient oral antibiotic therapy, he was treated with Keflex and clindamycin, afebrile, nontoxic, nonseptic on admission. -Lower extremity Doppler negative for DVT, he does have in the medial side of the cellulitis, I have requested Ortho to evaluate to rule out abscess. - continue with IV vancomycin, follow blood cultures -Tabetic consulted for further evaluation to rule out abscess, it was felt he has infected hematoma, so he went today to the OR for incision and debridement and closure of his hematoma.  Patient failed outpatient oral antibiotic treatment with Keflex and clindamycin. LE Dopplers negative for DVT.  No bony   Diabetes mellitus without complication (HCC): -  poor control. Last A1c 8.5 on 09/27/17. Home meds include glyburide, Jardiance, metformin all of which are on hold.  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  HYPERCHOLESTEROLEMIA: -zocor  Obstructive sleep apnea -CPAP  Essential hypertension: -fair control -Continue home medications: Felodipine, Prinzide  GERD: -Protonix  BPH: stable - Continue Avodart and Proscar  Alcohol use: Patient states that he drinks 2-3 times per week, several ounce of bourbon each time, last drink was last night, 4 ounce of bourbon. Currently no signs of withdrawal.  -CiWA protocol   Code Status: full Family Communication: Discussed with wife at bedside Disposition Plan: home once improved   Consultants:  Orthopedic  Procedures:  Irrigation and debridement, and closure of hematoma of left lower extremity by Dr. Eulah Pont on 11/06/2017  Antibiotics:  Vancomycin 9/26>> . Objective: Vitals:   11/06/17 1200 11/06/17 1212  BP: 120/67 133/81  Pulse: 70 71  Resp:  16  Temp:  97.8 F (36.6 C)  SpO2:  97%    Intake/Output Summary (Last 24 hours) at 11/06/2017 1449 Last data filed at 11/06/2017 1409 Gross per 24 hour  Intake 1100 ml  Output 3355 ml  Net -2255 ml   Filed Weights   11/03/17 1417  Weight: 99.8 kg    Exam:  Awake Alert, Oriented X 3, No new F.N deficits, Normal affect Symmetrical Chest wall movement, Good air movement bilaterally, CTAB RRR,No Gallops,Rubs or new Murmurs, No Parasternal Heave +ve B.Sounds, Abd Soft, No tenderness, No rebound - guarding or rigidity. Lower extremity wrapped    Data Reviewed: Basic Metabolic Panel: Recent Labs  Lab 11/03/17 1519 11/04/17 0356 11/05/17 0320 11/06/17 0446  NA 137 137 136 133*  K 4.1 3.4* 3.8 3.9  CL 97*  102 101 98  CO2 26 25 24 23   GLUCOSE 306* 191* 219* 225*  BUN 21 20 22 23   CREATININE 1.11 0.88 0.96 1.05  CALCIUM 10.2 9.2 9.5 9.4   Liver Function Tests: Recent Labs  Lab 11/03/17 1519  AST 32  ALT 43  ALKPHOS 87  BILITOT 0.9  PROT 7.5  ALBUMIN 4.5   No results for input(s): LIPASE, AMYLASE in the last 168 hours. No results  for input(s): AMMONIA in the last 168 hours. CBC: Recent Labs  Lab 11/03/17 1519 11/04/17 0356 11/05/17 0320 11/06/17 0446  WBC 7.4 4.9 5.2 6.6  NEUTROABS 5.4  --   --   --   HGB 15.1 13.2 13.8 14.8  HCT 46.0 39.9 42.0 45.0  MCV 93.1 91.9 92.3 92.2  PLT 195 162 175 191   Cardiac Enzymes: No results for input(s): CKTOTAL, CKMB, CKMBINDEX, TROPONINI in the last 168 hours. BNP (last 3 results) No results for input(s): BNP in the last 8760 hours.  ProBNP (last 3 results) No results for input(s): PROBNP in the last 8760 hours.  CBG: Recent Labs  Lab 11/05/17 1622 11/05/17 2050 11/06/17 0652 11/06/17 1013 11/06/17 1144  GLUCAP 261* 241* 221* 216* 166*    Recent Results (from the past 240 hour(s))  Culture, blood (routine x 2)     Status: None (Preliminary result)   Collection Time: 11/03/17  3:21 PM  Result Value Ref Range Status   Specimen Description BLOOD LEFT ANTECUBITAL  Final   Special Requests   Final    BOTTLES DRAWN AEROBIC AND ANAEROBIC Blood Culture adequate volume   Culture   Final    NO GROWTH 3 DAYS Performed at [facility], [address], [place], [place] [address]    Report Status PENDING  Incomplete  Culture, blood (routine x 2)     Status: None (Preliminary result)   Collection Time: 11/03/17  9:00 PM  Result Value Ref Range Status   Specimen Description BLOOD BLOOD RIGHT FOREARM  Final   Special Requests   Final    BOTTLES DRAWN AEROBIC AND ANAEROBIC Blood Culture adequate volume   Culture   Final    NO GROWTH 3 DAYS Performed at [facility], [address] [place], [place] [address]    Report Status PENDING  Incomplete  MRSA PCR Screening     Status: None   Collection Time: 11/06/17 12:30 AM  Result Value Ref Range Status   MRSA by PCR NEGATIVE NEGATIVE Final    Comment:        The GeneXpert MRSA Assay (FDA approved for NASAL specimens only), is one component of a comprehensive MRSA colonization surveillance program. It  is not intended to diagnose MRSA infection nor to guide or monitor treatment for MRSA infections. Performed at [facility], [address], [place], [place] [address]      Studies: No results found.  Scheduled Meds: . acetaminophen      . acetaminophen      . acetaminophen  1,000 mg Oral Q8H  . bromocriptine  5 mg Oral Daily  . [START ON 11/07/2017] canagliflozin  100 mg Oral QAC breakfast  . enoxaparin (LOVENOX) injection  40 mg Subcutaneous Q24H  . felodipine  5 mg Oral Daily  . finasteride  5 mg Oral Daily  . folic acid  1 mg Oral Daily  . [START ON 11/07/2017] glyBURIDE  5 mg Oral Q breakfast  . hydrochlorothiazide  50 mg  Oral Daily  . insulin aspart  0-15 Units Subcutaneous TID WC  . insulin aspart  0-5 Units Subcutaneous QHS  . ketorolac  7.5 mg Intravenous Q6H  . ketorolac      . lisinopril  40 mg Oral Daily  . LORazepam  0-4 mg Intravenous Q12H  . multivitamin with minerals  1 tablet Oral Daily  . pantoprazole  40 mg Oral Daily  . simvastatin  40 mg Oral QHS  . thiamine  100 mg Oral Daily   Or  . thiamine  100 mg Intravenous Daily   Continuous Infusions: . vancomycin 1,250 mg (11/05/17 2014)    Principal Problem:   Cellulitis of left leg Active Problems:   Diabetes mellitus without complication (HCC)   HYPERCHOLESTEROLEMIA   Obstructive sleep apnea   HTN (hypertension)   Alcohol use   BPH (benign prostatic hyperplasia)   GERD (gastroesophageal reflux disease)   Cellulitis     Huey Bienenstock MD Pager [phone] [facility] If 7PM-7AM, please contact night-coverage at [email or web address], password [facility] 11/06/2017, 2:49 PM  LOS: 2 days

## 2017-11-06 NOTE — Progress Notes (Signed)
Orthopedic Progress Note: S/O: Adam Cardenas underwent I&D and closure of his hematoma in the OR today.  There was no purulence or evidence of infection in this area.  Evacuation did successfully relieve tension in this pretibial area.    A/P: Traumatic noninfected pretibial hematoma left leg. Patient with poorly controlled DM and left LE cellulitis improved with medical management / IV ABX.   WBAT LLE.  Continue DM management per medicine team.  Leave sterile dressings on and dry until follow up.  Elevate leg and apply ice to reduce pain/swelling.  Follow up in the office with Dr. Wandra Feinstein in 2 weeks.     Adam Billet III, PA-C 11/06/2017 12:52 PM

## 2017-11-06 NOTE — Transfer of Care (Signed)
Immediate Anesthesia Transfer of Care Note  Patient: Adam Cardenas  Procedure(s) Performed: IRRIGATION AND DEBRIDEMENT LEG (Left )  Patient Location: PACU  Anesthesia Type:General  Level of Consciousness: sedated and drowsy  Airway & Oxygen Therapy: Patient Spontanous Breathing and Patient connected to nasal cannula oxygen  Post-op Assessment: Report given to RN and Post -op Vital signs reviewed and stable  Post vital signs: Reviewed and stable  Last Vitals:  Vitals Value Taken Time  BP 136/79 11/06/2017 10:07 AM  Temp    Pulse 84 11/06/2017 10:11 AM  Resp 17 11/06/2017 10:11 AM  SpO2 99 % 11/06/2017 10:11 AM  Vitals shown include unvalidated device data.  Last Pain:  Vitals:   11/06/17 0752  TempSrc:   PainSc: 0-No pain         Complications: No apparent anesthesia complications

## 2017-11-06 NOTE — Plan of Care (Signed)

## 2017-11-06 NOTE — Op Note (Signed)
11/06/2017  10:01 AM  PATIENT:  Adam Cardenas    PRE-OPERATIVE DIAGNOSIS:  INFECTION LEFT LEG   POST-OPERATIVE DIAGNOSIS:  Same  PROCEDURE:  IRRIGATION AND DEBRIDEMENT LEG  SURGEON:  Sheral Apley, MD  ASSISTANT: Aquilla Hacker, PA-C, he was present and scrubbed throughout the case, critical for completion in a timely fashion, and for retraction, instrumentation, and closure.   ANESTHESIA:   gen  PREOPERATIVE INDICATIONS:  Adam Cardenas is a  68 y.o. male with a diagnosis of INFECTION LEFT LEG  who failed conservative measures and elected for surgical management.    The risks benefits and alternatives were discussed with the patient preoperatively including but not limited to the risks of infection, bleeding, nerve injury, cardiopulmonary complications, the need for revision surgery, among others, and the patient was willing to proceed.  OPERATIVE IMPLANTS: none  OPERATIVE FINDINGS: hematoma, no purulence  BLOOD LOSS: min  COMPLICATIONS: none  TOURNIQUET TIME: none  OPERATIVE PROCEDURE:  Patient was identified in the preoperative holding area and site was marked by me He was transported to the operating theater and placed on the table in supine position taking care to pad all bony prominences. After a preincinduction time out anesthesia was induced. The left lower extremity was prepped and draped in normal sterile fashion and a pre-incision timeout was performed. He received ancef for preoperative antibiotics.   Made a longitudinal incision just posterior to this hematoma.  I used a hemostat to probe this deep space he had a large amount of clotted blood and hematoma.  There was no purulent fluid.  I then debrided any devitalized tissue this was deep.  This was done using a curette  I then thoroughly irrigated his hematoma with 3 L of saline.  Next performed a closure of this wound it was 3 cm.  Sterile dressing was applied he was awoken and taken to the PACU in stable  condition  POST OPERATIVE PLAN: Weightbearing as tolerated mobilize and chemical DVT prophylaxis

## 2017-11-06 NOTE — Plan of Care (Signed)
  Problem: Skin Integrity: Goal: Skin integrity will improve Outcome: Progressing   Problem: Education: Goal: Knowledge of General Education information will improve Description Including pain rating scale, medication(s)/side effects and non-pharmacologic comfort measures Outcome: Progressing   Problem: Health Behavior/Discharge Planning: Goal: Ability to manage health-related needs will improve Outcome: Progressing   Problem: Clinical Measurements: Goal: Ability to maintain clinical measurements within normal limits will improve Outcome: Progressing   Problem: Activity: Goal: Risk for activity intolerance will decrease Outcome: Progressing   Problem: Nutrition: Goal: Adequate nutrition will be maintained Outcome: Progressing   Problem: Coping: Goal: Level of anxiety will decrease Outcome: Progressing   Problem: Elimination: Goal: Will not experience complications related to bowel motility Outcome: Progressing Goal: Will not experience complications related to urinary retention Outcome: Progressing   Problem: Pain Managment: Goal: General experience of comfort will improve Outcome: Progressing   Problem: Safety: Goal: Ability to remain free from injury will improve Outcome: Progressing   Problem: Skin Integrity: Goal: Risk for impaired skin integrity will decrease Outcome: Progressing

## 2017-11-06 NOTE — Anesthesia Preprocedure Evaluation (Addendum)
Anesthesia Evaluation  Patient identified by MRN, date of birth, ID band Patient awake    Reviewed: Allergy & Precautions, NPO status , Patient's Chart, lab work & pertinent test results  History of Anesthesia Complications Negative for: history of anesthetic complications  Airway Mallampati: II  TM Distance: >3 FB Neck ROM: Full    Dental no notable dental hx. (+) Dental Advisory Given   Pulmonary sleep apnea and Continuous Positive Airway Pressure Ventilation , former smoker,    Pulmonary exam normal        Cardiovascular hypertension, Pt. on medications Normal cardiovascular exam     Neuro/Psych negative neurological ROS  negative psych ROS   GI/Hepatic Neg liver ROS, GERD  Medicated,  Endo/Other  diabetes  Renal/GU negative Renal ROS     Musculoskeletal negative musculoskeletal ROS (+)   Abdominal   Peds  Hematology negative hematology ROS (+)   Anesthesia Other Findings Day of surgery medications reviewed with the patient.  Reproductive/Obstetrics                            Anesthesia Physical Anesthesia Plan  ASA: III  Anesthesia Plan: General   Post-op Pain Management:    Induction: Intravenous  PONV Risk Score and Plan: 3 and Ondansetron, Dexamethasone and Diphenhydramine  Airway Management Planned: LMA  Additional Equipment:   Intra-op Plan:   Post-operative Plan: Extubation in OR  Informed Consent: I have reviewed the patients History and Physical, chart, labs and discussed the procedure including the risks, benefits and alternatives for the proposed anesthesia with the patient or authorized representative who has indicated his/her understanding and acceptance.   Dental advisory given  Plan Discussed with: CRNA and Anesthesiologist  Anesthesia Plan Comments:        Anesthesia Quick Evaluation

## 2017-11-06 NOTE — Progress Notes (Signed)
Patient already on NIV on arrival tolerating it well, no distress or complications noted

## 2017-11-06 NOTE — Anesthesia Procedure Notes (Signed)
Procedure Name: LMA Insertion Date/Time: 11/06/2017 9:32 AM Performed by: Jed Limerick, CRNA Pre-anesthesia Checklist: Patient identified, Emergency Drugs available, Suction available and Patient being monitored Patient Re-evaluated:Patient Re-evaluated prior to induction Oxygen Delivery Method: Circle System Utilized Preoxygenation: Pre-oxygenation with 100% oxygen Induction Type: IV induction Ventilation: Mask ventilation without difficulty LMA: LMA inserted LMA Size: 5.0 Number of attempts: 1 Placement Confirmation: positive ETCO2 Tube secured with: Tape Dental Injury: Teeth and Oropharynx as per pre-operative assessment

## 2017-11-06 NOTE — Anesthesia Postprocedure Evaluation (Signed)
Anesthesia Post Note  Patient: Adam Cardenas  Procedure(s) Performed: IRRIGATION AND DEBRIDEMENT LEG (Left )     Patient location during evaluation: PACU Anesthesia Type: General Level of consciousness: sedated Pain management: pain level controlled Vital Signs Assessment: post-procedure vital signs reviewed and stable Respiratory status: spontaneous breathing and respiratory function stable Cardiovascular status: stable Postop Assessment: no apparent nausea or vomiting Anesthetic complications: no    Last Vitals:  Vitals:   11/06/17 1115 11/06/17 1130  BP: 119/72 105/72  Pulse: 70 78  Resp: 15 19  Temp:    SpO2: 98% 97%    Last Pain:  Vitals:   11/06/17 1130  TempSrc:   PainSc: Asleep                 Bowen Goyal DANIEL

## 2017-11-06 NOTE — Discharge Instructions (Signed)
You may bear weight as tolerated.   Elevate leg to reduce pain / swelling. Take Tylenol instead of Norco for mild pain. Keep dressings on and dry until follow up with Dr. Wandra Feinstein in 1-2 weeks.

## 2017-11-07 ENCOUNTER — Encounter (HOSPITAL_COMMUNITY): Payer: Self-pay | Admitting: Orthopedic Surgery

## 2017-11-07 LAB — BASIC METABOLIC PANEL
Anion gap: 8 (ref 5–15)
BUN: 22 mg/dL (ref 8–23)
CALCIUM: 8.8 mg/dL — AB (ref 8.9–10.3)
CO2: 26 mmol/L (ref 22–32)
CREATININE: 0.95 mg/dL (ref 0.61–1.24)
Chloride: 102 mmol/L (ref 98–111)
GFR calc Af Amer: >60 mL/min (ref >60)
GLUCOSE: 207 mg/dL — AB (ref 70–99)
POTASSIUM: 3.9 mmol/L (ref 3.5–5.1)
SODIUM: 136 mmol/L (ref 135–145)

## 2017-11-07 LAB — CBC
HCT: 40.7 % (ref 39.0–52.0)
Hemoglobin: 13.3 g/dL (ref 13.0–17.0)
MCH: 30.4 pg (ref 26.0–34.0)
MCHC: 32.7 g/dL (ref 30.0–36.0)
MCV: 92.9 fL (ref 78.0–100.0)
PLATELETS: 183 10*3/uL (ref 150–400)
RBC: 4.38 MIL/uL (ref 4.22–5.81)
RDW: 13.7 % (ref 11.5–15.5)
WBC: 4.4 10*3/uL (ref 4.0–10.5)

## 2017-11-07 LAB — GLUCOSE, CAPILLARY
GLUCOSE-CAPILLARY: 196 mg/dL — AB (ref 70–99)
Glucose-Capillary: 201 mg/dL — ABNORMAL HIGH (ref 70–99)
Glucose-Capillary: 164 mg/dL — ABNORMAL HIGH (ref 70–99)

## 2017-11-07 MED ORDER — DOXYCYCLINE HYCLATE 100 MG PO TABS: 100.0000 mg | ORAL_TABLET | Freq: Two times a day (BID) | ORAL | 0 refills | Status: DC

## 2017-11-07 NOTE — Discharge Summary (Signed)
Adam Cardenas, is a 67 y.o. male  DOB [birth year]  MRN [id number].  Admission date:  11/03/2017  Admitting Physician  Lorretta Harp, MD  Discharge Date:  11/07/2017   Primary MD  Carilyn Goodpasture, PA-C  Recommendations for primary care physician for things to follow:  -Please check CBC, BMP during next visit -Patient will need to follow with orthopedic in 10 days   Admission Diagnosis  Hypercholesterolemia [E78.00] Essential hypertension [I10]   Discharge Diagnosis  Hypercholesterolemia [E78.00] Essential hypertension [I10]    Principal Problem:   Cellulitis of left leg Active Problems:   Diabetes mellitus without complication (HCC)   HYPERCHOLESTEROLEMIA   Obstructive sleep apnea   HTN (hypertension)   Alcohol use   BPH (benign prostatic hyperplasia)   GERD (gastroesophageal reflux disease)   Cellulitis      Past Medical History:  Diagnosis Date  . Cellulitis   . Diabetes mellitus   . HTN (hypertension)   . Hypercholesterolemia   . Seasonal allergic rhinitis   . Sleep apnea     Past Surgical History:  Procedure Laterality Date  . I&D EXTREMITY Left 11/06/2017   Procedure: IRRIGATION AND DEBRIDEMENT LEG;  Surgeon: Sheral Apley, MD;  Location: [facility] OR;  Service: Orthopedics;  Laterality: Left;  . repair deviated septum         History of present illness and  Hospital Course:     Kindly see H&P for history of present illness and admission details, please review complete Labs, Consult reports and Test reports for all details in brief  HPI  from the history and physical done on the day of admission 11/03/2017 HPI: Adam Cardenas is a 67 y.o. male with medical history significant of hypertension, hyperlipidemia, diabetes mellitus, GERD, BPH, OSA on CPAP, who presents with left leg pain.  Patient states that he fell off a tractor trailer on Saturday and bumped his left lower leg. No  head or neck injury. No LOC. Initially he just noted contusion and skin abrasion in the left mid shin area, without penetration wound in the leg.  He states that the pain has worsened since 2 days ago. He developed erythema and warmth in the left mid shin area, which has been spreading down to the lateral side of left foot.  The pain is constant, can reach 10 out of 10 severity, sharp, nonradiating.  Denies any fever or chills.  Patient was seen in urgent care, and put on Keflex two days ago, without improvement, then switched to clindamycin yesterday, still no improvement.  His pain has been persistent, therefore comes back to emergency room for further evaluation and treatment. Patient denies any chest pain, shortness breath, cough, fever, chills.  No nausea, vomiting, diarrhea or abdominal pain.  No symptoms of UTI or unilateral weakness.  ED Course: pt was found to have WBC 7.4, lactic acid of 1.75, electrolytes renal function okay, temperature normal, no tachycardia, no tachypnea, oxygen satting 93% on room air.  X-ray of left tibia/fibula, left foot pain and  ankle are negative for bony fracture.  Lower extremity venous Dopplers negative for DVT.    Hospital Course   Adam Winship Whiteis a 67 y.o.malewith medical history significant ofhypertension, hyperlipidemia, diabetes mellitus, GERD, BPH, OSA on CPAP, who presents with left leg pain.  Patient states thathefell off a tractor trailer on Saturday and bumped his left lower leg. No head or neck injury. No LOC.Initially hejustnoted contusion and skin abrasion in the left mid shin area,without penetration wound in the leg. Treated with Keflex and clindamycin as an outpatient with no improvement, he was admitted for IV antibiotics administration, he had significant improvement on vancomycin, he is afebrile with no leukocytosis, was seen by orthopedic for swelling/hematoma at the site of injury, he went for OR for incision and debridement and  evacuation 11/06/2017, he will be discharged on oral antibiotic with recommendation to follow with Ortho as an outpatient.   Cellulitis of left leg: -Patient failed outpatient oral antibiotic therapy, he was treated with Keflex and clindamycin, afebrile, nontoxic, nonseptic on admission.  He was treated with IV vancomycin during hospital stay, he will be discharged on another 10 days of oral doxycycline. -Lower extremity Doppler negative for DVT, he does have in the medial side of the cellulitis, -orthopedic  consulted for further evaluation to rule out abscess, it was felt he has infected hematoma, so he wentto the OR for incision and debridement and closure of his hematoma on 11/06/2017  Diabetes mellitus without complication (HCC): -  poor control.Last A1c8.5 on 09/27/17.  Resume home medication on discharge  HYPERCHOLESTEROLEMIA: -zocor  Obstructive sleep apnea -CPAP  Essential hypertension: -fair control -Continue home medications:Felodipine, Prinzide  GERD: -Protonix  BPH: stable - ContinueAvodartand Proscar  Alcohol ZOX:WRUEAVW states that he drinks 2-3 times per week,several ounce ofbourboneach time,he was kept on CIWA protocol during the day, there was no evidence of withdrawals during hospital stay.   Discharge Condition:  Stable   Follow UP  Follow-up Information    Sheral Apley, MD Follow up in 2 week(s).   Specialty:  Orthopedic Surgery Contact information: [address] [place] [address] [phone]        Carilyn Goodpasture, PA-C Follow up.   Specialty:  Family Medicine Contact information: [address] [place] [address] [phone]             Discharge Instructions  and  Discharge Medications     Discharge Instructions    Discharge instructions   Complete by:  As directed    Follow with Primary MD Carilyn Goodpasture, PA-C in 7 days   Get CBC, CMP,   by Primary MD next  visit.    Activity: As tolerated with Full fall precautions use walker/cane & assistance as needed   Disposition Home    Diet: Heart Healthy, carbohydrate modified    On your next visit with your primary care physician please Get Medicines reviewed and adjusted.   Please request your Prim.MD to go over all Hospital Tests and Procedure/Radiological results at the follow up, please get all Hospital records sent to your Prim MD by signing hospital release before you go home.   If you experience worsening of your admission symptoms, develop shortness of breath, life threatening emergency, suicidal or homicidal thoughts you must seek medical attention immediately by calling 911 or calling your MD immediately  if symptoms less severe.  You Must read complete instructions/literature along with all the possible adverse reactions/side effects for all the Medicines  you take and that have been prescribed to you. Take any new Medicines after you have completely understood and accpet all the possible adverse reactions/side effects.   Do not drive, operating heavy machinery, perform activities at heights, swimming or participation in water activities or provide baby sitting services if your were admitted for syncope or siezures until you have seen by Primary MD or a Neurologist and advised to do so again.  Do not drive when taking Pain medications.    Do not take more than prescribed Pain, Sleep and Anxiety Medications  Special Instructions: If you have smoked or chewed Tobacco  in the last 2 yrs please stop smoking, stop any regular Alcohol  and or any Recreational drug use.  Wear Seat belts while driving.   Please note  You were cared for by a hospitalist during your hospital stay. If you have any questions about your discharge medications or the care you received while you were in the hospital after you are discharged, you can call the unit and asked to speak with the hospitalist on call if  the hospitalist that took care of you is not available. Once you are discharged, your primary care physician will handle any further medical issues. Please note that NO REFILLS for any discharge medications will be authorized once you are discharged, as it is imperative that you return to your primary care physician (or establish a relationship with a primary care physician if you do not have one) for your aftercare needs so that they can reassess your need for medications and monitor your lab values.   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   doxycycline 100 MG tablet Commonly known as:  VIBRA-TABS Take 1 tablet (100 mg total) by mouth 2 (two) times daily.   dutasteride 0.5 MG capsule Commonly known as:  AVODART Take 0.5 mg by mouth daily.   empagliflozin 25 MG Tabs tablet Commonly known as:  JARDIANCE Take 25 mg by mouth daily.   felodipine 5 MG 24 hr tablet Commonly known as:  PLENDIL Take 5 mg by mouth daily.   finasteride 5 MG tablet Commonly known as:  PROSCAR Take 5 mg by mouth daily.   glyBURIDE 5 MG tablet Commonly known as:  DIABETA Take 1 tablet (5 mg total) by mouth daily with breakfast.   HYDROcodone-acetaminophen 5-325 MG tablet Commonly known as:  NORCO/VICODIN Take 1-2 tablets by mouth every 8 (eight) hours as needed for up to 5 days for moderate pain.   lisinopril-hydrochlorothiazide 20-25 MG tablet Commonly known as:  PRINZIDE,ZESTORETIC Take 2 tablets by mouth daily.   metFORMIN 500 MG 24 hr  tablet Commonly known as:  GLUCOPHAGE-XR Take 4 tablets (2,000 mg total) by mouth daily. What changed:  when to take this   omeprazole 20 MG capsule Commonly known as:  PRILOSEC Take 20 mg by mouth daily.   simvastatin 40 MG tablet Commonly known  as:  ZOCOR Take 40 mg by mouth at bedtime.            Durable Medical Equipment  (From admission, onward)         Start     Ordered   11/07/17 1153  For home use only DME Dan Humphreys  [facility])  Once    Question:  Patient needs a walker to treat with the following condition  Answer:  Leg hematoma   11/07/17 1154            Diet and Activity recommendation: See Discharge Instructions above   Consults obtained -  Orthopedic   Major procedures and Radiology Reports - PLEASE review detailed and final reports for all details, in brief -    Irrigation and debridement, and closure of hematoma of left lower extremity by Dr. Eulah Pont on 11/06/2017   Dg Tibia/fibula Left  Result Date: 11/03/2017 CLINICAL DATA:  Status post fall with left leg pain. EXAM: LEFT TIBIA AND FIBULA - 2 VIEW COMPARISON:  None. FINDINGS: There is no evidence of fracture or other focal bone lesions. Soft tissues are unremarkable. IMPRESSION: Negative. Electronically Signed   By: Sherian Rein M.D.   On: 11/03/2017 20:40   Dg Ankle 2 Views Left  Result Date: 11/03/2017 CLINICAL DATA:  Status post fall with left ankle pain. EXAM: LEFT ANKLE - 2 VIEW COMPARISON:  None. FINDINGS: There is no evidence of fracture, dislocation, or joint effusion. There is soft tissue swelling around the ankle. IMPRESSION: No acute fracture or dislocation. Electronically Signed   By: Sherian Rein M.D.   On: 11/03/2017 20:41   Dg Foot 2 Views Left  Result Date: 11/03/2017 CLINICAL DATA:  Status post fall with left foot pain. EXAM: LEFT FOOT - 2 VIEW COMPARISON:  None. FINDINGS: There is no evidence of fracture or dislocation. Soft tissues are unremarkable. IMPRESSION: No acute fracture  or dislocation. Electronically Signed   By: Sherian Rein M.D.   On: 11/03/2017 20:39    Micro Results     Recent Results (from the past 240 hour(s))  Culture, blood (routine x 2)     Status: None (Preliminary result)   Collection Time: 11/03/17  3:21 PM  Result Value Ref Range Status   Specimen Description BLOOD LEFT ANTECUBITAL  Final   Special Requests   Final    BOTTLES DRAWN AEROBIC AND ANAEROBIC Blood Culture adequate volume   Culture   Final    NO GROWTH 3 DAYS Performed at [facility], [address] [place], [place] [address]    Report Status PENDING  Incomplete  Culture, blood (routine x 2)     Status: None (Preliminary result)   Collection Time: 11/03/17  9:00 PM  Result Value Ref Range Status   Specimen Description BLOOD BLOOD RIGHT FOREARM  Final   Special Requests   Final    BOTTLES DRAWN AEROBIC AND ANAEROBIC Blood Culture adequate volume   Culture   Final    NO GROWTH 3 DAYS Performed at [facility], [address] [place], [place] [address]    Report Status PENDING  Incomplete  MRSA PCR Screening     Status: None   Collection Time: 11/06/17 12:30 AM  Result Value Ref Range Status   MRSA by PCR NEGATIVE NEGATIVE Final    Comment:        The GeneXpert MRSA Assay (FDA approved for NASAL specimens only), is one component of a comprehensive MRSA colonization surveillance program.  It is not intended to diagnose MRSA infection nor to guide or monitor treatment for MRSA infections. Performed at [facility], [address], [place], [place] [address]        Today   Subjective:   Adam Cardenas today has no headache,no chest abdominal pain,no new weakness tingling or numbness, feels much better wants to go home today.   Objective:   Blood pressure (!) 142/81, pulse 70, temperature 97.8 F (36.6 C), temperature source Oral, resp. rate 15, height 6\' 1"  (1.854 m), weight 99.8 kg, SpO2 97 %.   Intake/Output Summary (Last 24 hours)  at 11/07/2017 1155 Last data filed at 11/07/2017 0728 Gross per 24 hour  Intake -  Output 1200 ml  Net -1200 ml    Exam Awake Alert, Oriented x 3, No new F.N deficits, Normal affect Symmetrical Chest wall movement, Good air movement bilaterally, CTAB RRR,No Gallops,Rubs or new Murmurs, No Parasternal Heave +ve B.Sounds, Abd Soft, Non tender, No organomegaly appriciated, No rebound -guarding or rigidity. Lower extremity with Ace wrap, good capillary refill in his toes, able to move his toes with no deficits  Data Review   CBC w Diff:  Lab Results  Component Value Date   WBC 4.4 11/07/2017   HGB 13.3 11/07/2017   HCT 40.7 11/07/2017   PLT 183 11/07/2017   LYMPHOPCT 12 11/03/2017   MONOPCT 10 11/03/2017   EOSPCT 2 11/03/2017   BASOPCT 1 11/03/2017    CMP:  Lab Results  Component Value Date   NA 136 11/07/2017   K 3.9 11/07/2017   CL 102 11/07/2017   CO2 26 11/07/2017   BUN 22 11/07/2017   CREATININE 0.95 11/07/2017   PROT 7.5 11/03/2017   ALBUMIN 4.5 11/03/2017   BILITOT 0.9 11/03/2017   ALKPHOS 87 11/03/2017   AST 32 11/03/2017   ALT 43 11/03/2017  .   Total Time in preparing paper work, data evaluation and todays exam - 35 minutes  Huey Bienenstock M.D on 11/07/2017 at 11:55 AM  [facility]   Office  [phone]

## 2017-11-07 NOTE — Evaluation (Signed)
Physical Therapy Evaluation Patient Details Name: Adam Cardenas MRN: [id number] DOB: 11/06/1950 Today's Date: 11/07/2017   History of Present Illness  Pt is a 67 y/o male admitted secondary to LLE cellulitis. Pt is s/p I and D of LLE hematoma. Imaging negative for acute abnormality. PMH includes HTN, DM, OSA on CPAP, and alcohol use.   Clinical Impression  Pt admitted secondary to problem above with deficits below. Pt slightly guarded during gait and stair navigation, however, tolerated mobility fairly well. Required min guard to supervision for mobility tasks throughout. Pt reports his wife will be there to assist as needed at d/c. Will continue to follow acutely to maximize functional mobility independence and safety.     Follow Up Recommendations No PT follow up    Equipment Recommendations  Rolling walker with 5" wheels    Recommendations for Other Services       Precautions / Restrictions Precautions Precautions: None Restrictions Weight Bearing Restrictions: Yes LLE Weight Bearing: Weight bearing as tolerated      Mobility  Bed Mobility Overal bed mobility: Modified Independent                Transfers Overall transfer level: Needs assistance Equipment used: Rolling walker (2 wheeled) Transfers: Sit to/from Stand Sit to Stand: Supervision         General transfer comment: Supervision for safety. Demonstrated safe hand placement.   Ambulation/Gait Ambulation/Gait assistance: Supervision Gait Distance (Feet): 125 Feet Assistive device: Rolling walker (2 wheeled) Gait Pattern/deviations: Step-through pattern;Decreased stride length Gait velocity: Decreased    General Gait Details: Slow, guarded gait. Overall steady with use of RW. Educated about walking program to perform at home  Stairs Stairs: Yes Stairs assistance: Min guard Stair Management: Step to pattern;Backwards;Forwards;With walker Number of Stairs: 2 General stair comments: Practiced stair  navigation using RW. Min guard for safety. Verbal cues for sequencing. Gave handout for home use.   Wheelchair Mobility    Modified Rankin (Stroke Patients Only)       Balance Overall balance assessment: Needs assistance Sitting-balance support: No upper extremity supported;Feet supported Sitting balance-Leahy Scale: Good     Standing balance support: Bilateral upper extremity supported;No upper extremity supported;During functional activity Standing balance-Leahy Scale: Fair Standing balance comment: Able to maintain static standing without UE support                              Pertinent Vitals/Pain Pain Assessment: Faces Faces Pain Scale: Hurts a little bit Pain Location: LLE  Pain Descriptors / Indicators: Guarding Pain Intervention(s): Limited activity within patient's tolerance;Monitored during session;Repositioned    Home Living Family/patient expects to be discharged to:: Private residence Living Arrangements: Spouse/significant other Available Help at Discharge: Family;Available 24 hours/day Type of Home: House Home Access: Stairs to enter Entrance Stairs-Rails: None Entrance Stairs-Number of Steps: 3 Home Layout: One level Home Equipment: Cane - single point      Prior Function Level of Independence: Independent with assistive device(s)         Comments: Had been using cane for the past week, but was independent prior to that.      Hand Dominance        Extremity/Trunk Assessment   Upper Extremity Assessment Upper Extremity Assessment: Overall WFL for tasks assessed    Lower Extremity Assessment Lower Extremity Assessment: LLE deficits/detail LLE Deficits / Details: LLE wrapped in ace bandage following surgery. Deficits consistent with post op pain and weakness.  Cervical / Trunk Assessment Cervical / Trunk Assessment: Normal  Communication   Communication: No difficulties  Cognition Arousal/Alertness: Awake/alert Behavior  During Therapy: WFL for tasks assessed/performed Overall Cognitive Status: Within Functional Limits for tasks assessed                                        General Comments      Exercises     Assessment/Plan    PT Assessment Patient needs continued PT services  PT Problem List Decreased mobility;Decreased knowledge of use of DME;Decreased knowledge of precautions;Pain       PT Treatment Interventions DME instruction;Gait training;Stair training;Functional mobility training;Therapeutic activities;Therapeutic exercise;Balance training;Patient/family education    PT Goals (Current goals can be found in the Care Plan section)  Acute Rehab PT Goals Patient Stated Goal: to go home  PT Goal Formulation: With patient Time For Goal Achievement: 11/21/17 Potential to Achieve Goals: Good    Frequency Min 3X/week   Barriers to discharge        Co-evaluation               AM-PAC PT "6 Clicks" Daily Activity  Outcome Measure Difficulty turning over in bed (including adjusting bedclothes, sheets and blankets)?: None Difficulty moving from lying on back to sitting on the side of the bed? : A Little Difficulty sitting down on and standing up from a chair with arms (e.g., wheelchair, bedside commode, etc,.)?: A Little Help needed moving to and from a bed to chair (including a wheelchair)?: None Help needed walking in hospital room?: None Help needed climbing 3-5 steps with a railing? : A Little 6 Click Score: 21    End of Session Equipment Utilized During Treatment: Gait belt Activity Tolerance: Patient tolerated treatment well Patient left: in bed;with call bell/phone within reach Nurse Communication: Mobility status PT Visit Diagnosis: Other abnormalities of gait and mobility (R26.89);Pain Pain - Right/Left: Left Pain - part of body: Leg    Time: 1201-1219 PT Time Calculation (min) (ACUTE ONLY): 18 min   Charges:   PT Evaluation $PT Eval Low  Complexity: 1 Low          Gladys Damme, PT, DPT  Acute Rehabilitation Services  Pager: [phone] Office: [phone]   Lehman Prom 11/07/2017, 12:23 PM

## 2017-11-07 NOTE — Progress Notes (Signed)
Pharmacy Antibiotic Note  Adam Cardenas is a 67 y.o. male admitted on 11/03/2017 with cellulitis.  Pharmacy has been consulted for Vancomycin dosing - day #5. S/p I&D, closure of hematoma of LE on 9/29.  SCr 0.95 stable. Will not check VT today, as dose was held 9/29 AM when patient went to OR.  Plan: - Continue vancomycin at 1250 mg IV every 12 hours - Will continue to follow renal function, culture results, LOT, and antibiotic de-escalation plans   Height: 6\' 1"  (185.4 cm) Weight: 220 lb (99.8 kg) IBW/kg (Calculated) : 79.9  Temp (24hrs), Avg:97.7 F (36.5 C), Min:97.2 F (36.2 C), Max:97.9 F (36.6 C)  Recent Labs  Lab 11/03/17 1519 11/03/17 1542 11/04/17 0356 11/05/17 0320 11/06/17 0446 11/07/17 0410  WBC 7.4  --  4.9 5.2 6.6 4.4  CREATININE 1.11  --  0.88 0.96 1.05 0.95  LATICACIDVEN  --  1.75  --   --   --   --     Estimated Creatinine Clearance: 92.5 mL/min (by C-G formula based on SCr of 0.95 mg/dL).    Allergies  Allergen Reactions  . 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 Sitagliptin Other (See Comments)    Pancreatitis     Antimicrobials this admission: Vanc 9/26 >> Cefazolin 9/29 x 1  Dose adjustments this admission: 9/27 - adjusted dose empirically 1g IV q12h > 1250mg  IV q12h  Microbiology results: 9/26 BCx >> ngtd  Babs Bertin, PharmD, BCPS Clinical Pharmacist Clinical phone [phone] Please check AMION for all [facility] contact numbers 11/07/2017 9:23 AM

## 2017-11-08 LAB — CULTURE, BLOOD (ROUTINE X 2)
Culture: NO GROWTH
Culture: NO GROWTH
Special Requests: ADEQUATE
Special Requests: ADEQUATE

## 2017-11-28 ENCOUNTER — Ambulatory Visit: Payer: BLUE CROSS/BLUE SHIELD | Admitting: Endocrinology

## 2017-11-28 ENCOUNTER — Encounter: Payer: Self-pay | Admitting: Endocrinology

## 2017-11-28 VITALS — BP 116/60 | HR 83 | Ht 73.0 in | Wt 226.6 lb

## 2017-11-28 DIAGNOSIS — E1129 Type 2 diabetes mellitus with other diabetic kidney complication: Secondary | ICD-10-CM

## 2017-11-28 DIAGNOSIS — R809 Proteinuria, unspecified: Secondary | ICD-10-CM

## 2017-11-28 LAB — POCT GLYCOSYLATED HEMOGLOBIN (HGB A1C): Hemoglobin A1C: 8.7 % — AB (ref 4.0–5.6)

## 2017-11-28 MED ORDER — COLESEVELAM HCL 625 MG PO TABS: 1875.0000 mg | ORAL_TABLET | Freq: Two times a day (BID) | ORAL | 3 refills | Status: DC

## 2017-11-28 NOTE — Patient Instructions (Addendum)
Because of thje swelling, you should stop taking the pioglitazone. I have sent a prescription to your pharmacy, to add "colvesalam.".  Please continue the same other medications.  check your blood sugar once a day.  vary the time of day when you check, between before the 3 meals, and at bedtime.  also check if you have symptoms of your blood sugar being too high or too low.  please keep a record of the readings and bring it to your next appointment here (or you can bring the meter itself).  You can write it on any piece of paper.  please call us sooner if your blood sugar goes below 70, or if you have a lot of readings over 200.  If that happens, we can add "acarbose." Please come back for a follow-up appointment in 3 months.

## 2017-11-28 NOTE — Progress Notes (Signed)
Subjective:    Patient ID: Adam Cardenas, male    DOB: 17-Aug-1950, 67 y.o.   MRN: [id number]  HPI Pt returns for f/u of diabetes mellitus: DM type: 2 Dx'ed: 2005 Complications: polyneuropathy and nephropathy.  Therapy: 4 oral meds DKA: never Severe hypoglycemia: never Pancreatitis: once, in 2015, possibly due to [place].   Pancreatic imaging: well-defined ovoid 9 mm hypodensity over the distal body (2015 CT).   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  He brings a record of his cbg's which I have reviewed today.  It varies from 170-230.  pt states he feels better in general.  He was recently in the hospital with leg cellulitis.   Past Medical History:  Diagnosis Date  . Cellulitis   . Diabetes mellitus   . HTN (hypertension)   . Hypercholesterolemia   . Seasonal allergic rhinitis   . Sleep apnea     Past Surgical History:  Procedure Laterality Date  . I&D EXTREMITY Left 11/06/2017   Procedure: IRRIGATION AND DEBRIDEMENT LEG;  Surgeon: Sheral Apley, MD;  Location: [facility] OR;  Service: Orthopedics;  Laterality: Left;  . repair deviated septum      Social History   Socioeconomic History  . Marital status: Unknown    Spouse name: Not on file  . Number of children: Not on file  . Years of education: Not on file  . Highest education level: Not on file  Occupational History  . Occupation: truck Hospital doctor - long haul  Social Needs  . Financial resource strain: Not on file  . Food insecurity:    Worry: Not on file    Inability: Not on file  . Transportation needs:    Medical: Not on file    Non-medical: Not on file  Tobacco Use  . Smoking status: Former Smoker    Packs/day: 1.00    Years: 30.00    Pack years: 30.00    Types: Cigarettes    Last attempt to quit: 02/08/1998    Years since quitting: 19.8  . Smokeless tobacco: Never Used  Substance and  Sexual Activity  . Alcohol use: Yes    Comment: rare-liquor  . Drug use: No  . Sexual activity: Not on file  Lifestyle  . Physical activity:    Days per week: Not on file    Minutes per session: Not on file  . Stress: Not on file  Relationships  . Social connections:    Talks on phone: Not on file    Gets together: Not on file    Attends religious service: Not on file    Active member of club or organization: Not on file    Attends meetings of clubs or organizations: Not on file    Relationship status: Not on file  . Intimate partner violence:    Fear of current or ex partner: Not on file    Emotionally abused: Not on file    Physically abused: Not on file    Forced sexual activity: Not on file  Other Topics Concern  . Not on file  Social History Narrative  . Not on file    Current Outpatient Medications on File Prior to Visit  Medication Sig Dispense Refill  . bromocriptine (PARLODEL) 5 MG capsule Take 1 capsule (5 mg total) by mouth daily. 90 capsule 3  . empagliflozin (JARDIANCE)  25 MG TABS tablet Take 25 mg by mouth daily. 90 tablet 3  . felodipine (PLENDIL) 5 MG 24 hr tablet Take 5 mg by mouth daily.    . finasteride (PROSCAR) 5 MG tablet Take 5 mg by mouth daily.  11  . glyBURIDE (DIABETA) 5 MG tablet Take 1 tablet (5 mg total) by mouth daily with breakfast. 90 tablet 3  . lisinopril-hydrochlorothiazide (PRINZIDE,ZESTORETIC) 20-25 MG tablet Take 2 tablets by mouth daily.     . metFORMIN (GLUCOPHAGE-XR) 500 MG 24 hr tablet Take 4 tablets (2,000 mg total) by mouth daily. (Patient taking differently: Take 2,000 mg by mouth every evening. ) 360 tablet 3  . omeprazole (PRILOSEC) 20 MG capsule Take 20 mg by mouth daily.  3  . simvastatin (ZOCOR) 40 MG tablet Take 40 mg by mouth at bedtime.       No current facility-administered medications on file prior to visit.     Allergies  Allergen Reactions  . 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 Sitagliptin Other (See Comments)    Pancreatitis     Family History  Problem Relation Age of Onset  . Cancer Father   . Diabetes Maternal Grandfather     BP 116/60 (BP Location: Left Arm, Patient Position: Sitting, Cuff Size: Normal)   Pulse 83   Ht 6\' 1"  (1.854 m)   Wt 226 lb 9.6 oz (102.8 kg)   SpO2 92%   BMI 29.90 kg/m   Review of Systems He denies hypoglycemia.      Objective:   Physical Exam VITAL SIGNS:  See vs page GENERAL: no distress Pulses: dorsalis pedis intact bilat.   MSK: no deformity of the feet CV: 2+ bilat leg edema Skin:  no ulcer on the feet.  normal color and temp on the feet. Neuro: sensation is intact to touch on the feet, but decreased from normal There is bilateral onychomycosis of the toenails.    Lab Results  Component Value Date   CREATININE 0.95 11/07/2017   BUN 22 11/07/2017   NA 136 11/07/2017   K 3.9 11/07/2017   CL 102 11/07/2017   CO2 26 11/07/2017    A1c=8.7%    Assessment & Plan:  Type 2 DM: worse Edema: due to pioglitazone.   Occupational status: he needs to control DM without insulin.    Patient Instructions  Because of thje swelling, you should stop taking the pioglitazone. I have sent a prescription to your pharmacy, to add "colvesalam.".  Please continue the same other medications.  check your blood sugar once a day.  vary the time of day when you check, between before the 3 meals, and at bedtime.  also check if you have symptoms of your blood sugar being too high or too low.  please keep a record of the readings and bring it to your next appointment here (or you can bring the meter itself).  You can write it on any piece of paper.  please call us sooner if your blood sugar goes below 70, or if you have a lot of readings over 200.  If that happens, we can add "acarbose." Please come back for a follow-up appointment in 3 months.

## 2018-03-06 ENCOUNTER — Telehealth: Payer: Self-pay | Admitting: Endocrinology

## 2018-03-06 ENCOUNTER — Ambulatory Visit: Payer: BLUE CROSS/BLUE SHIELD | Admitting: Endocrinology

## 2018-03-06 DIAGNOSIS — Z0289 Encounter for other administrative examinations: Secondary | ICD-10-CM

## 2018-03-06 NOTE — Telephone Encounter (Signed)
Please schedule f/u appt for next available appointment  

## 2018-03-06 NOTE — Telephone Encounter (Signed)
Patient no showed today's appt. Please advise on how to follow up. °A. No follow up necessary. °B. Follow up urgent. Contact patient immediately. °C. Follow up necessary. Contact patient and schedule visit in ___ days. °D. Follow up advised. Contact patient and schedule visit in ____weeks. ° °Would you like the NS fee to be applied to this visit? ° °

## 2018-03-07 NOTE — Telephone Encounter (Signed)
Patient scheduled an appointment for 06/26/18

## 2018-03-07 NOTE — Telephone Encounter (Signed)
Patient states that he had a completed appointment with Dr. Everardo All on 02/20/18 and that he came in for labs yesterday 03/06/18. I explained to patient that appointment was missed with Dr. Everardo All 03/06/18. Patient stated we must have a Ship broker. Patient did not want to reschedule missed appointment because he states that he did not miss any appointments and that he already saw Dr. Everardo All on 02/20/18.

## 2018-03-07 NOTE — Telephone Encounter (Signed)
Please refer to Dr. Ellison's response 

## 2018-03-14 ENCOUNTER — Telehealth: Payer: Self-pay

## 2018-03-14 NOTE — Telephone Encounter (Signed)
Received notification from PCP indicating A1C is 10.1. Per Dr. Everardo All, pt will need to re-schedule appt to a sooner date/time. Currently scheduled for 06/26/18. Message routed to scheduling coordinator for scheduling purposes.

## 2018-03-14 NOTE — Telephone Encounter (Signed)
LMTCB to schedule sooner appointment 

## 2018-03-21 ENCOUNTER — Other Ambulatory Visit: Payer: Self-pay | Admitting: Endocrinology

## 2018-03-21 NOTE — Telephone Encounter (Signed)
Please advise if refill is appropriate 

## 2018-03-21 NOTE — Telephone Encounter (Signed)
Please refill x 1 Ov is due  

## 2018-03-27 DIAGNOSIS — H04123 Dry eye syndrome of bilateral lacrimal glands: Secondary | ICD-10-CM | POA: Insufficient documentation

## 2018-03-27 DIAGNOSIS — H25813 Combined forms of age-related cataract, bilateral: Secondary | ICD-10-CM | POA: Insufficient documentation

## 2018-03-27 DIAGNOSIS — H0102A Squamous blepharitis right eye, upper and lower eyelids: Secondary | ICD-10-CM | POA: Insufficient documentation

## 2018-04-14 ENCOUNTER — Other Ambulatory Visit: Payer: Self-pay | Admitting: Endocrinology

## 2018-04-20 DIAGNOSIS — Z961 Presence of intraocular lens: Secondary | ICD-10-CM | POA: Insufficient documentation

## 2018-05-05 ENCOUNTER — Telehealth: Payer: Self-pay | Admitting: Endocrinology

## 2018-05-05 NOTE — Telephone Encounter (Signed)
°   colesevelam (WELCHOL) 625 MG tablet      Patient called and sttated that he is needing a PA for him to receive this medication from the pharmacy      [id number] [place], [place] - [address]

## 2018-05-05 NOTE — Telephone Encounter (Signed)
Would you like to do PA or change medication? °

## 2018-05-05 NOTE — Telephone Encounter (Signed)
Ov is due.  Can he do webex?

## 2018-05-06 ENCOUNTER — Other Ambulatory Visit: Payer: Self-pay | Admitting: Endocrinology

## 2018-05-08 NOTE — Telephone Encounter (Signed)
Please advise if refill is appropriate last OV 11/28/2017 next OV 06/26/2018

## 2018-05-11 NOTE — Telephone Encounter (Signed)
Would you please call this pt and see if he would like to do webex or something?

## 2018-05-12 NOTE — Telephone Encounter (Signed)
Could you please schedule this 

## 2018-05-12 NOTE — Telephone Encounter (Signed)
Called patient to try and set up a phone visit. He stated he is at work and would not know when he could do this. He wanted next week but advised that Dr Everardo All will be out of the office. Patient said he is doing fine and does have an appt set up in may   Please advise

## 2018-05-12 NOTE — Telephone Encounter (Signed)
We need some kind of visit (in person, webex, or phone), in order to address.

## 2018-05-15 NOTE — Telephone Encounter (Signed)
Done

## 2018-05-23 ENCOUNTER — Ambulatory Visit: Payer: Self-pay | Admitting: Endocrinology

## 2018-05-24 LAB — HM DIABETES EYE EXAM

## 2018-05-31 ENCOUNTER — Encounter: Payer: Self-pay | Admitting: General Practice

## 2018-06-01 ENCOUNTER — Ambulatory Visit (INDEPENDENT_AMBULATORY_CARE_PROVIDER_SITE_OTHER): Payer: BLUE CROSS/BLUE SHIELD | Admitting: Endocrinology

## 2018-06-01 ENCOUNTER — Other Ambulatory Visit: Payer: Self-pay

## 2018-06-01 DIAGNOSIS — E119 Type 2 diabetes mellitus without complications: Secondary | ICD-10-CM

## 2018-06-01 NOTE — Patient Instructions (Addendum)
please come in to have the A1c checked. Based on the results, we'll change the glyburide to repaglinide." Please continue the same other medications.  check your blood sugar once a day.  vary the time of day when you check, between before the 3 meals, and at bedtime.  also check if you have symptoms of your blood sugar being too high or too low.  please keep a record of the readings and bring it to your next appointment here (or you can bring the meter itself).  You can write it on any piece of paper.  please call us sooner if your blood sugar goes below 70, or if you have a lot of readings over 200. If necessary, we can also add "acarbose."   Please come back for a follow-up appointment in 3 months.

## 2018-06-01 NOTE — Progress Notes (Signed)
Subjective:    Patient ID: Adam Cardenas, male    DOB: 02/26/1950, 68 y.o.   MRN: [id number]  HPI  telehealth visit today via doxy video visit.  Alternatives to telehealth are presented to this patient, and the patient agrees to the telehealth visit. Pt is advised of the cost of the visit, and agrees to this, also.   Patient is at home, and I am at the office.   Pt returns for f/u of diabetes mellitus: DM type: 2 Dx'ed: 2005 Complications: polyneuropathy and nephropathy.  Therapy: 5 oral meds DKA: never Severe hypoglycemia: never Pancreatitis: once, in 2015, possibly due to [place].   Pancreatic imaging: well-defined ovoid 9 mm hypodensity over the distal body (2015 CT).   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  He says cbg varies from 97-262.  Past Medical History:  Diagnosis Date  . Cellulitis   . Diabetes mellitus   . HTN (hypertension)   . Hypercholesterolemia   . Seasonal allergic rhinitis   . Sleep apnea     Past Surgical History:  Procedure Laterality Date  . CATARACT EXTRACTION Bilateral   . I&D EXTREMITY Left 11/06/2017   Procedure: IRRIGATION AND DEBRIDEMENT LEG;  Surgeon: Sheral Apley, MD;  Location: [facility] OR;  Service: Orthopedics;  Laterality: Left;  . repair deviated septum      Social History   Socioeconomic History  . Marital status: Unknown    Spouse name: Not on file  . Number of children: Not on file  . Years of education: Not on file  . Highest education level: Not on file  Occupational History  . Occupation: truck Hospital doctor - long haul  Social Needs  . Financial resource strain: Not on file  . Food insecurity:    Worry: Not on file    Inability: Not on file  . Transportation needs:    Medical: Not on file    Non-medical: Not on file  Tobacco Use  . Smoking status: Former Smoker    Packs/day: 1.00    Years: 30.00     Pack years: 30.00    Types: Cigarettes    Last attempt to quit: 02/08/1998    Years since quitting: 20.3  . Smokeless tobacco: Never Used  Substance and Sexual Activity  . Alcohol use: Yes    Comment: rare-liquor  . Drug use: No  . Sexual activity: Not on file  Lifestyle  . Physical activity:    Days per week: Not on file    Minutes per session: Not on file  . Stress: Not on file  Relationships  . Social connections:    Talks on phone: Not on file    Gets together: Not on file    Attends religious service: Not on file    Active member of club or organization: Not on file    Attends meetings of clubs or organizations: Not on file    Relationship status: Not on file  . Intimate partner violence:    Fear of current or ex partner: Not on file    Emotionally abused: Not on file    Physically abused: Not on file    Forced sexual activity: Not on file  Other Topics Concern  . Not on file  Social History Narrative  . Not on file    Current Outpatient Medications on File Prior to  Visit  Medication Sig Dispense Refill  . bromocriptine (PARLODEL) 5 MG capsule Take 1 capsule (5 mg total) by mouth daily. 90 capsule 3  . colesevelam (WELCHOL) 625 MG tablet TAKE 3 TABLETS BY MOUTH 2 TIMES DAILY WITH A MEAL. 180 tablet 0  . felodipine (PLENDIL) 5 MG 24 hr tablet Take 5 mg by mouth daily.    . finasteride (PROSCAR) 5 MG tablet Take 5 mg by mouth daily.  11  . JARDIANCE 25 MG TABS tablet TAKE 1 TABLET BY MOUTH EVERY DAY 90 tablet 3  . lisinopril-hydrochlorothiazide (PRINZIDE,ZESTORETIC) 20-25 MG tablet Take 2 tablets by mouth daily.     . metFORMIN (GLUCOPHAGE-XR) 500 MG 24 hr tablet Take 4 tablets (2,000 mg total) by mouth daily. (Patient taking differently: Take 2,000 mg by mouth every evening. ) 360 tablet 3  . omeprazole (PRILOSEC) 20 MG capsule Take 20 mg by mouth daily.  3  . simvastatin (ZOCOR) 40 MG tablet Take 40 mg by mouth at bedtime.       No current facility-administered  medications on file prior to visit.     Allergies  Allergen Reactions  . 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 Sitagliptin Other (See Comments)    Pancreatitis     Family History  Problem Relation Age of Onset  . Cancer Father   . Diabetes Maternal Grandfather      Review of Systems He denies hypoglycemia    Objective:   Physical Exam   Lab Results  Component Value Date   HGBA1C 9.4 (A) 06/02/2018        Assessment & Plan:  Type 2 DM: worse.  Occupational status: he wants to control DM without insulin.  We discussed the fact that that is unlikely.    Patient Instructions  please come in to have the A1c checked. Based on the results, we'll change the glyburide to repaglinide." Please continue the same other medications.  check your blood sugar once a day.  vary the time of day when you check, between before the 3 meals, and at bedtime.  also check if you have symptoms of your blood sugar being too high or too low.  please keep a record of the readings and bring it to your next appointment here (or you can bring the meter itself).  You can write it on any piece of paper.  please call us sooner if your blood sugar goes below 70, or if you have a lot of readings over 200. If necessary, we can also add "acarbose."   Please come back for a follow-up appointment in 3 months.

## 2018-06-02 ENCOUNTER — Other Ambulatory Visit: Payer: Self-pay

## 2018-06-02 ENCOUNTER — Other Ambulatory Visit (INDEPENDENT_AMBULATORY_CARE_PROVIDER_SITE_OTHER): Payer: BLUE CROSS/BLUE SHIELD

## 2018-06-02 DIAGNOSIS — R809 Proteinuria, unspecified: Secondary | ICD-10-CM | POA: Diagnosis not present

## 2018-06-02 DIAGNOSIS — E1129 Type 2 diabetes mellitus with other diabetic kidney complication: Secondary | ICD-10-CM

## 2018-06-02 LAB — POCT GLYCOSYLATED HEMOGLOBIN (HGB A1C): Hemoglobin A1C: 9.4 % — AB (ref 4.0–5.6)

## 2018-06-02 MED ORDER — GLYBURIDE 5 MG PO TABS: 10.0000 mg | ORAL_TABLET | Freq: Every day | ORAL | 3 refills | Status: DC

## 2018-06-13 ENCOUNTER — Telehealth: Payer: Self-pay

## 2018-06-13 NOTE — Telephone Encounter (Signed)
Opened in error

## 2018-06-26 ENCOUNTER — Ambulatory Visit: Payer: Self-pay | Admitting: Endocrinology

## 2018-07-04 ENCOUNTER — Other Ambulatory Visit: Payer: Self-pay | Admitting: Endocrinology

## 2018-07-06 ENCOUNTER — Other Ambulatory Visit: Payer: Self-pay

## 2018-07-06 ENCOUNTER — Telehealth: Payer: Self-pay

## 2018-07-06 ENCOUNTER — Ambulatory Visit (INDEPENDENT_AMBULATORY_CARE_PROVIDER_SITE_OTHER): Payer: BLUE CROSS/BLUE SHIELD

## 2018-07-06 DIAGNOSIS — E1129 Type 2 diabetes mellitus with other diabetic kidney complication: Secondary | ICD-10-CM

## 2018-07-06 DIAGNOSIS — R809 Proteinuria, unspecified: Secondary | ICD-10-CM | POA: Diagnosis not present

## 2018-07-06 LAB — POCT GLYCOSYLATED HEMOGLOBIN (HGB A1C): Hemoglobin A1C: 9.6 % — AB (ref 4.0–5.6)

## 2018-07-06 MED ORDER — COLESEVELAM HCL 625 MG PO TABS: ORAL_TABLET | ORAL | 0 refills | Status: DC

## 2018-07-06 NOTE — Telephone Encounter (Signed)
Pt requesting refill on Welchol. Advised we will discuss with Dr. Everardo All to determine if refill is appropriate.

## 2018-07-06 NOTE — Telephone Encounter (Signed)
Yes, I rx'ed it for DM.  Please refill PRN.

## 2018-07-06 NOTE — Telephone Encounter (Signed)
colesevelam (WELCHOL) 625 MG tablet 180 tablet 0 07/06/2018    Sig: TAKE 3 TABLETS BY MOUTH 2 TIMES DAILY WITH A MEAL.   Sent to pharmacy as: colesevelam [facility]) 625 MG tablet   Notes to Pharmacy: Future refill requests will require an appt   E-Prescribing Status: Receipt confirmed by pharmacy (07/06/2018 3:50 PM EDT)

## 2018-07-06 NOTE — Progress Notes (Signed)
Pt presents today for nurse visit for completion of A1C. 

## 2018-07-19 ENCOUNTER — Ambulatory Visit (INDEPENDENT_AMBULATORY_CARE_PROVIDER_SITE_OTHER): Payer: BC Managed Care – PPO | Admitting: Endocrinology

## 2018-07-19 ENCOUNTER — Encounter: Payer: Self-pay | Admitting: Endocrinology

## 2018-07-19 ENCOUNTER — Other Ambulatory Visit: Payer: Self-pay

## 2018-07-19 VITALS — BP 116/62 | HR 79 | Temp 98.5°F | Wt 210.2 lb

## 2018-07-19 DIAGNOSIS — E119 Type 2 diabetes mellitus without complications: Secondary | ICD-10-CM

## 2018-07-19 DIAGNOSIS — R609 Edema, unspecified: Secondary | ICD-10-CM | POA: Diagnosis not present

## 2018-07-19 MED ORDER — ACARBOSE 25 MG PO TABS: 25.0000 mg | ORAL_TABLET | Freq: Three times a day (TID) | ORAL | 11 refills | Status: DC

## 2018-07-19 NOTE — Progress Notes (Signed)
Subjective:    Patient ID: Adam Cardenas, male    DOB: 07/15/1950, 68 y.o.   MRN: [id number]  HPI Pt returns for f/u of diabetes mellitus:  DM type: 2 Dx'ed: 2005 Complications: polyneuropathy and nephropathy.  Therapy: 5 oral meds DKA: never Severe hypoglycemia: never Pancreatitis: once, in 2015, possibly due to [place].   Pancreatic imaging: well-defined ovoid 9 mm hypodensity over the distal body (2015 CT).   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   Interval history: he takes meds as rx'ed.  He says cbg's are in the 200's.  pt states he feels well in general.   Past Medical History:  Diagnosis Date  . Cellulitis   . Diabetes mellitus   . HTN (hypertension)   . Hypercholesterolemia   . Seasonal allergic rhinitis   . Sleep apnea     Past Surgical History:  Procedure Laterality Date  . CATARACT EXTRACTION Bilateral   . I&D EXTREMITY Left 11/06/2017   Procedure: IRRIGATION AND DEBRIDEMENT LEG;  Surgeon: Sheral ApleyMurphy, Timothy D, MD;  [facility] OR;  Service: Orthopedics;  Laterality: Left;  . repair deviated septum      Social History   Socioeconomic History  . Marital status: Unknown    Spouse name: Not on file  . Number of children: Not on file  . Years of education: Not on file  . Highest education level: Not on file  Occupational History  . Occupation: truck Hospital doctordriver - long haul  Social Needs  . Financial resource strain: Not on file  . Food insecurity    Worry: Not on file    Inability: Not on file  . Transportation needs    Medical: Not on file    Non-medical: Not on file  Tobacco Use  . Smoking status: Former Smoker    Packs/day: 1.00    Years: 30.00    Pack years: 30.00    Types: Cigarettes    Quit date: 02/08/1998    Years since quitting: 20.4  . Smokeless tobacco: Never Used  Substance and Sexual Activity  . Alcohol use: Yes    Comment: rare-liquor  . Drug use: No  .  Sexual activity: Not on file  Lifestyle  . Physical activity    Days per week: Not on file    Minutes per session: Not on file  . Stress: Not on file  Relationships  . Social Musicianconnections    Talks on phone: Not on file    Gets together: Not on file    Attends religious service: Not on file    Active member of club or organization: Not on file    Attends meetings of clubs or organizations: Not on file    Relationship status: Not on file  . Intimate partner violence    Fear of current or ex partner: Not on file    Emotionally abused: Not on file    Physically abused: Not on file    Forced sexual activity: Not on file  Other Topics Concern  . Not on file  Social History Narrative  . Not on file    Current Outpatient Medications on File Prior to Visit  Medication Sig Dispense Refill  . bromocriptine (PARLODEL) 5 MG capsule Take 1 capsule (5 mg total) by mouth daily. 90 capsule 3  . colesevelam (WELCHOL) 625 MG tablet TAKE 3 TABLETS BY MOUTH 2 TIMES DAILY WITH A  MEAL. 180 tablet 0  . felodipine (PLENDIL) 5 MG 24 hr tablet Take 5 mg by mouth daily.    . finasteride (PROSCAR) 5 MG tablet Take 5 mg by mouth daily.  11  . glyBURIDE (DIABETA) 5 MG tablet Take 2 tablets (10 mg total) by mouth daily with breakfast. 180 tablet 3  . JARDIANCE 25 MG TABS tablet TAKE 1 TABLET BY MOUTH EVERY DAY 90 tablet 3  . lisinopril-hydrochlorothiazide (PRINZIDE,ZESTORETIC) 20-25 MG tablet Take 2 tablets by mouth daily.     . metFORMIN (GLUCOPHAGE-XR) 500 MG 24 hr tablet TAKE 4 TABLETS BY MOUTH DAILY. 360 tablet 3  . omeprazole (PRILOSEC) 20 MG capsule Take 20 mg by mouth daily.  3  . simvastatin (ZOCOR) 40 MG tablet Take 40 mg by mouth at bedtime.       No current facility-administered medications on file prior to visit.     Allergies  Allergen Reactions  . 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 Sitagliptin Other (See Comments)     Pancreatitis     Family History  Problem Relation Age of Onset  . Cancer Father   . Diabetes Maternal Grandfather     BP 116/62 (BP Location: Left Arm, Patient Position: Sitting, Cuff Size: Normal)   Pulse 79   Temp 98.5 F (36.9 C) (Oral)   Wt 210 lb 3.2 oz (95.3 kg)   SpO2 98%   BMI 27.73 kg/m   Review of Systems He denies hypoglycemia    Objective:   Physical Exam VITAL SIGNS:  See vs page GENERAL: no distress Pulses: dorsalis pedis intact bilat.   MSK: no deformity of the feet CV: 1+ bilat leg edema Skin:  no ulcer on the feet.  normal color and temp on the feet. Neuro: sensation is intact to touch on the feet, but decreased from normal There is bilateral onychomycosis of the toenails.   Lab Results  Component Value Date   CREATININE 0.95 11/07/2017   BUN 22 11/07/2017   NA 136 11/07/2017   K 3.9 11/07/2017   CL 102 11/07/2017   CO2 26 11/07/2017    Lab Results  Component Value Date   HGBA1C 9.6 (A) 07/06/2018       Assessment & Plan:  Type 2 DM: he needs increased rx.    Edema: This limits rx options.   occupational status: we discussed.  I declined to sign CDL waiver.    Patient Instructions  I have sent a prescription to your pharmacy, to add "acarbose."  Please continue the same other medications.  check your blood sugar once a day.  vary the time of day when you check, between before the 3 meals, and at bedtime.  also check if you have symptoms of your blood sugar being too high or too low.  please keep a record of the readings and bring it to your next appointment here (or you can bring the meter itself).  You can write it on any piece of paper.  please call us sooner if your blood sugar goes below 70, or if you have a lot of readings over 200. Please come back for a follow-up appointment in 3 months.

## 2018-07-19 NOTE — Patient Instructions (Addendum)
I have sent a prescription to your pharmacy, to add "acarbose."  Please continue the same other medications.  check your blood sugar once a day.  vary the time of day when you check, between before the 3 meals, and at bedtime.  also check if you have symptoms of your blood sugar being too high or too low.  please keep a record of the readings and bring it to your next appointment here (or you can bring the meter itself).  You can write it on any piece of paper.  please call us sooner if your blood sugar goes below 70, or if you have a lot of readings over 200. Please come back for a follow-up appointment in 3 months.

## 2018-07-29 ENCOUNTER — Other Ambulatory Visit: Payer: Self-pay | Admitting: Endocrinology

## 2018-07-29 DIAGNOSIS — E1129 Type 2 diabetes mellitus with other diabetic kidney complication: Secondary | ICD-10-CM

## 2018-07-31 NOTE — Telephone Encounter (Signed)
Please advise if refill is appropriate 

## 2018-08-24 ENCOUNTER — Other Ambulatory Visit: Payer: Self-pay | Admitting: Endocrinology

## 2018-08-24 DIAGNOSIS — E1129 Type 2 diabetes mellitus with other diabetic kidney complication: Secondary | ICD-10-CM

## 2018-08-28 ENCOUNTER — Ambulatory Visit: Payer: BLUE CROSS/BLUE SHIELD | Admitting: Endocrinology

## 2018-09-10 ENCOUNTER — Other Ambulatory Visit: Payer: Self-pay | Admitting: Endocrinology

## 2018-09-17 ENCOUNTER — Other Ambulatory Visit: Payer: Self-pay | Admitting: Endocrinology

## 2018-09-17 DIAGNOSIS — E1129 Type 2 diabetes mellitus with other diabetic kidney complication: Secondary | ICD-10-CM

## 2018-09-23 ENCOUNTER — Other Ambulatory Visit: Payer: Self-pay | Admitting: Endocrinology

## 2018-10-09 ENCOUNTER — Other Ambulatory Visit: Payer: Self-pay

## 2018-10-09 ENCOUNTER — Ambulatory Visit: Payer: BC Managed Care – PPO | Admitting: Endocrinology

## 2018-10-09 ENCOUNTER — Encounter: Payer: Self-pay | Admitting: Endocrinology

## 2018-10-09 VITALS — BP 142/70 | HR 70 | Ht 73.0 in | Wt 210.2 lb

## 2018-10-09 DIAGNOSIS — I10 Essential (primary) hypertension: Secondary | ICD-10-CM | POA: Diagnosis not present

## 2018-10-09 DIAGNOSIS — E119 Type 2 diabetes mellitus without complications: Secondary | ICD-10-CM | POA: Diagnosis not present

## 2018-10-09 DIAGNOSIS — E1142 Type 2 diabetes mellitus with diabetic polyneuropathy: Secondary | ICD-10-CM

## 2018-10-09 LAB — POCT GLYCOSYLATED HEMOGLOBIN (HGB A1C): Hemoglobin A1C: 9.5 % — AB (ref 4.0–5.6)

## 2018-10-09 MED ORDER — ACARBOSE 50 MG PO TABS: 50.0000 mg | ORAL_TABLET | Freq: Three times a day (TID) | ORAL | 3 refills | Status: DC

## 2018-10-09 NOTE — Patient Instructions (Addendum)
Your blood pressure is high today.  Please see your primary care provider soon, to have it rechecked I have sent a prescription to your pharmacy, to add increase the acarbose.  Please continue the same other medications.   I am really worried about how high your blood sugar is.  People with blood sugar as high as yours get sick and even die much sooner than those who keep it in a better range.  Only insulin will get then blood sugar back to normal.  check your blood sugar once a day.  vary the time of day when you check, between before the 3 meals, and at bedtime.  also check if you have symptoms of your blood sugar being too high or too low.  please keep a record of the readings and bring it to your next appointment here (or you can bring the meter itself).  You can write it on any piece of paper.  please call us sooner if your blood sugar goes below 70, or if you have a lot of readings over 200. Please come back for a follow-up appointment in 3 months.

## 2018-10-09 NOTE — Progress Notes (Signed)
Subjective:    Patient ID: Adam Cardenas, male    DOB: 1950-08-21, 68 y.o.   MRN: [id number]  HPI Pt returns for f/u of diabetes mellitus:  DM type: 2 Dx'ed: 2005 Complications: polyneuropathy and nephropathy.  Therapy: 6 oral meds DKA: never Severe hypoglycemia: never Pancreatitis: once, in 2015, possibly due to [place].   Pancreatic imaging: well-defined ovoid 9 mm hypodensity over the distal body (2015 CT).   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   Interval history: he takes meds as rx'ed.  He brings his meter, with his cbg's which I have reviewed today.  cbg varies from 151-260 pt states he feels well in general.   Past Medical History:  Diagnosis Date  . Cellulitis   . Diabetes mellitus   . HTN (hypertension)   . Hypercholesterolemia   . Seasonal allergic rhinitis   . Sleep apnea     Past Surgical History:  Procedure Laterality Date  . CATARACT EXTRACTION Bilateral   . I&D EXTREMITY Left 11/06/2017   Procedure: IRRIGATION AND DEBRIDEMENT LEG;  Surgeon: Sheral Apley, MD;  Location: [facility] OR;  Service: Orthopedics;  Laterality: Left;  . repair deviated septum      Social History   Socioeconomic History  . Marital status: Unknown    Spouse name: Not on file  . Number of children: Not on file  . Years of education: Not on file  . Highest education level: Not on file  Occupational History  . Occupation: truck Hospital doctor - long haul  Social Needs  . Financial resource strain: Not on file  . Food insecurity    Worry: Not on file    Inability: Not on file  . Transportation needs    Medical: Not on file    Non-medical: Not on file  Tobacco Use  . Smoking status: Former Smoker    Packs/day: 1.00    Years: 30.00    Pack years: 30.00    Types: Cigarettes    Quit date: 02/08/1998    Years since quitting: 20.6  . Smokeless tobacco: Never Used  Substance and Sexual Activity  .  Alcohol use: Yes    Comment: rare-liquor  . Drug use: No  . Sexual activity: Not on file  Lifestyle  . Physical activity    Days per week: Not on file    Minutes per session: Not on file  . Stress: Not on file  Relationships  . Social Musician on phone: Not on file    Gets together: Not on file    Attends religious service: Not on file    Active member of club or organization: Not on file    Attends meetings of clubs or organizations: Not on file    Relationship status: Not on file  . Intimate partner violence    Fear of current or ex partner: Not on file    Emotionally abused: Not on file    Physically abused: Not on file    Forced sexual activity: Not on file  Other Topics Concern  . Not on file  Social History Narrative  . Not on file    Current Outpatient Medications on File Prior to Visit  Medication Sig Dispense Refill  . bromocriptine (PARLODEL) 5 MG capsule TAKE 1 CAPSULE BY MOUTH EVERY DAY 90 capsule 3  . colesevelam (WELCHOL) 625 MG tablet TAKE 3 TABLETS  BY MOUTH TWICE A DAY WITH MEALS 180 tablet 0  . felodipine (PLENDIL) 5 MG 24 hr tablet Take 5 mg by mouth daily.    . finasteride (PROSCAR) 5 MG tablet Take 5 mg by mouth daily.  11  . glyBURIDE (DIABETA) 5 MG tablet Take 2 tablets (10 mg total) by mouth daily with breakfast. 180 tablet 3  . JARDIANCE 25 MG TABS tablet TAKE 1 TABLET BY MOUTH EVERY DAY 90 tablet 3  . lisinopril-hydrochlorothiazide (PRINZIDE,ZESTORETIC) 20-25 MG tablet Take 2 tablets by mouth daily.     . metFORMIN (GLUCOPHAGE-XR) 500 MG 24 hr tablet TAKE 4 TABLETS BY MOUTH DAILY. 360 tablet 3  . omeprazole (PRILOSEC) 20 MG capsule Take 20 mg by mouth daily.  3  . simvastatin (ZOCOR) 40 MG tablet Take 40 mg by mouth at bedtime.       No current facility-administered medications on file prior to visit.     Allergies  Allergen Reactions  . 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 Sitagliptin Other (See Comments)    Pancreatitis     Family History  Problem Relation Age of Onset  . Cancer Father   . Diabetes Maternal Grandfather     BP (!) 142/70 (BP Location: Left Arm, Patient Position: Sitting, Cuff Size: Large)   Pulse 70   Ht 6\' 1"  (1.854 m)   Wt 210 lb 3.2 oz (95.3 kg)   SpO2 97%   BMI 27.73 kg/m    Review of Systems He has lost a few lbs, due to his efforts. Denies diarrhea    Objective:   Physical Exam VITAL SIGNS:  See vs page GENERAL: no distress Pulses: dorsalis pedis intact bilat.   MSK: no deformity of the feet CV: 1+ bilat leg edema Skin:  no ulcer on the feet.  normal color and temp on the feet. Neuro: sensation is intact to touch on the feet, but decreased from normal.   There is bilateral onychomycosis of the toenails.   Lab Results  Component Value Date   HGBA1C 9.5 (A) 10/09/2018   Lab Results  Component Value Date   CREATININE 0.95 11/07/2017   BUN 22 11/07/2017   NA 136 11/07/2017   K 3.9 11/07/2017   CL 102 11/07/2017   CO2 26 11/07/2017       Assessment & Plan:  HTN: is noted today.  Type 2 DM, with PN: he needs increased rx.   Occupational status: he wants to try to control DM without insulin.  I told him this is unlikely.   Patient Instructions  Your blood pressure is high today.  Please see your primary care provider soon, to have it rechecked I have sent a prescription to your pharmacy, to add increase the acarbose.  Please continue the same other medications.   I am really worried about how high your blood sugar is.  People with blood sugar as high as yours get sick and even die much sooner than those who keep it in a better range.  Only insulin will get then blood sugar back to normal.  check your blood sugar once a day.  vary the time of day when you check, between before the 3 meals, and at bedtime.  also check if you have symptoms of your blood sugar being too high or too low.  please keep a  record of the readings and bring it  to your next appointment here (or you can bring the meter itself).  You can write it on any piece of paper.  please call us sooner if your blood sugar goes below 70, or if you have a lot of readings over 200. Please come back for a follow-up appointment in 3 months.

## 2018-10-19 ENCOUNTER — Ambulatory Visit: Payer: BC Managed Care – PPO | Admitting: Endocrinology

## 2019-01-03 ENCOUNTER — Other Ambulatory Visit: Payer: Self-pay

## 2019-01-08 ENCOUNTER — Other Ambulatory Visit: Payer: Self-pay

## 2019-01-08 ENCOUNTER — Encounter: Payer: Self-pay | Admitting: Endocrinology

## 2019-01-08 ENCOUNTER — Ambulatory Visit: Payer: BC Managed Care – PPO | Admitting: Endocrinology

## 2019-01-08 VITALS — BP 160/80 | HR 85 | Ht 73.0 in | Wt 208.0 lb

## 2019-01-08 DIAGNOSIS — E119 Type 2 diabetes mellitus without complications: Secondary | ICD-10-CM | POA: Diagnosis not present

## 2019-01-08 DIAGNOSIS — R809 Proteinuria, unspecified: Secondary | ICD-10-CM

## 2019-01-08 DIAGNOSIS — E1129 Type 2 diabetes mellitus with other diabetic kidney complication: Secondary | ICD-10-CM

## 2019-01-08 LAB — POCT GLYCOSYLATED HEMOGLOBIN (HGB A1C): Hemoglobin A1C: 8.4 % — AB (ref 4.0–5.6)

## 2019-01-08 MED ORDER — ACARBOSE 100 MG PO TABS: 100.0000 mg | ORAL_TABLET | Freq: Three times a day (TID) | ORAL | 3 refills | Status: DC

## 2019-01-08 NOTE — Progress Notes (Signed)
Subjective:    Patient ID: Adam Cardenas, male    DOB: February 11, 1950, 68 y.o.   MRN: [id number]  HPI Pt returns for f/u of diabetes mellitus:  DM type: 2 Dx'ed: 7124 Complications: polyneuropathy and nephropathy.  Therapy: 6 oral meds DKA: never Severe hypoglycemia: never Pancreatitis: once, in 2015, possibly due to [place].   Pancreatic imaging: well-defined ovoid 9 mm hypodensity over the distal body (2015 CT).   Other: he needs CDL, in order to work as a Pharmacist, community; he has never been on insulin; he did not tolerate pioglitazole (edema).   Interval history: he takes meds as rx'ed.  He brings his meter, with his cbg's which I have reviewed today.  cbg varies from 154-239. pt states he feels well in general.   Past Medical History:  Diagnosis Date   Cellulitis    Diabetes mellitus    HTN (hypertension)    Hypercholesterolemia    Seasonal allergic rhinitis    Sleep apnea     Past Surgical History:  Procedure Laterality Date   CATARACT EXTRACTION Bilateral    I&D EXTREMITY Left 11/06/2017   Procedure: IRRIGATION AND DEBRIDEMENT LEG;  Surgeon: Renette Butters, MD;  Location: [place];  Service: Orthopedics;  Laterality: Left;   repair deviated septum      Social History   Socioeconomic History   Marital status: Unknown    Spouse name: Not on file   Number of children: Not on file   Years of education: Not on file   Highest education level: Not on file  Occupational History   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[place] resource strain: Not on file   Food insecurity    Worry: Not on file    Inability: Not on file   Transportation needs    Medical: Not on file    Non-medical: Not on file  Tobacco Use   Smoking status: Former Smoker    Packs/day: 1.00    Years: 30.00    Pack years: 30.00    Types: Cigarettes    Quit date: 02/08/1998    Years since quitting: 20.9   Smokeless tobacco: Never Used  Substance and Sexual Activity    Alcohol use: Yes    Comment: rare-liquor   Drug use: No   Sexual activity: Not on file  Lifestyle   Physical activity    Days per week: Not on file    Minutes per session: Not on file   Stress: Not on file  Relationships   Social connections    Talks on phone: Not on file    Gets together: Not on file    Attends religious service: Not on file    Active member of club or organization: Not on file    Attends meetings of clubs or organizations: Not on file    Relationship status: Not on file   Intimate partner violence    Fear of current or ex partner: Not on file    Emotionally abused: Not on file    Physically abused: Not on file    Forced sexual activity: Not on file  Other Topics Concern   Not on file  Social History Narrative   Not on file    Current Outpatient Medications on File Prior to Visit  Medication Sig Dispense Refill   bromocriptine (PARLODEL) 5 MG capsule TAKE 1 CAPSULE BY MOUTH EVERY DAY 90 capsule 3   colesevelam (WELCHOL) 625 MG tablet TAKE 3 TABLETS  BY MOUTH TWICE A DAY WITH MEALS 180 tablet 0   felodipine (PLENDIL) 5 MG 24 hr tablet Take 5 mg by mouth daily.     finasteride (PROSCAR) 5 MG tablet Take 5 mg by mouth daily.  11   glyBURIDE (DIABETA) 5 MG tablet Take 2 tablets (10 mg total) by mouth daily with breakfast. 180 tablet 3   JARDIANCE 25 MG TABS tablet TAKE 1 TABLET BY MOUTH EVERY DAY 90 tablet 3   lisinopril-hydrochlorothiazide (PRINZIDE,ZESTORETIC) 20-25 MG tablet Take 2 tablets by mouth daily.      metFORMIN (GLUCOPHAGE-XR) 500 MG 24 hr tablet TAKE 4 TABLETS BY MOUTH DAILY. 360 tablet 3   omeprazole (PRILOSEC) 20 MG capsule Take 20 mg by mouth daily.  3   simvastatin (ZOCOR) 40 MG tablet Take 40 mg by mouth at bedtime.       No current facility-administered medications on file prior to visit.     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      Objective:   Physical Exam VITAL SIGNS:  See vs page GENERAL: no distress Pulses: dorsalis pedis intact bilat.   MSK: no deformity of the feet CV: 1+ bilat leg edema Skin:  no ulcer on the feet.  normal color and temp on the feet.  Neuro: sensation is intact to touch on the feet.   Ext: there is bilateral onychomycosis of the toenails.    Lab Results  Component Value Date   HGBA1C 8.4 (A) 01/08/2019   Lab Results  Component Value Date   CREATININE 0.95 11/07/2017   BUN 22 11/07/2017   NA 136 11/07/2017   K 3.9 11/07/2017   CL 102 11/07/2017   CO2 26 11/07/2017       Assessment & Plan:  HTN: is noted today.  Type 2 DM: he needs increased rx.  Occupational status: in this setting, he wants to control DM without insulin.  Edema: This limits rx options  Patient Instructions  Your blood pressure is high today.  Please see your primary care provider soon, to have it rechecked I have sent a prescription to your pharmacy, to add increase the acarbose again.  Please continue the same other medications.   check your blood sugar once a day.  vary the time of day when you check, between before the 3 meals, and at bedtime.  also check if you have symptoms of your blood sugar being too high or too low.  please keep a record of the readings and bring it to your next appointment here (or you can bring the meter itself).  You can write it on any piece of paper.  please call 11/09/2017 sooner if your blood sugar goes below 70, or if you have a lot of readings over 200. Please come back for a follow-up appointment in 3 months.

## 2019-01-08 NOTE — Patient Instructions (Addendum)
Your blood pressure is high today.  Please see your primary care provider soon, to have it rechecked I have sent a prescription to your pharmacy, to add increase the acarbose again.  Please continue the same other medications.   check your blood sugar once a day.  vary the time of day when you check, between before the 3 meals, and at bedtime.  also check if you have symptoms of your blood sugar being too high or too low.  please keep a record of the readings and bring it to your next appointment here (or you can bring the meter itself).  You can write it on any piece of paper.  please call us sooner if your blood sugar goes below 70, or if you have a lot of readings over 200. Please come back for a follow-up appointment in 3 months.

## 2019-02-25 ENCOUNTER — Other Ambulatory Visit: Payer: Self-pay | Admitting: Endocrinology

## 2019-02-25 DIAGNOSIS — R809 Proteinuria, unspecified: Secondary | ICD-10-CM

## 2019-02-25 DIAGNOSIS — E1129 Type 2 diabetes mellitus with other diabetic kidney complication: Secondary | ICD-10-CM

## 2019-03-25 ENCOUNTER — Ambulatory Visit: Payer: BC Managed Care – PPO | Attending: Internal Medicine

## 2019-03-25 DIAGNOSIS — Z23 Encounter for immunization: Secondary | ICD-10-CM | POA: Insufficient documentation

## 2019-03-25 NOTE — Progress Notes (Signed)
   Covid-19 Vaccination Clinic  Name:  BRADEN CIMO    MRN: [id number] DOB: 08-03-1950  03/25/2019  Mr. Gehlhausen was observed post Covid-19 immunization for 15 minutes without incidence. He was provided with Vaccine Information Sheet and instruction to access the V-Safe system.   Mr. Kirchman was instructed to call 911 with any severe reactions post vaccine: Marland Kitchen Difficulty breathing  . Swelling of your face and throat  . A fast heartbeat  . A bad rash all over your body  . Dizziness and weakness    Immunizations Administered    Name Date Dose VIS Date Route   Pfizer COVID-19 Vaccine 03/25/2019 10:00 AM 0.3 mL 01/19/2019 Intramuscular   Manufacturer: [employer], [employer]   Lot: [id number]   NDC: [address]-1

## 2019-03-28 ENCOUNTER — Other Ambulatory Visit: Payer: Self-pay | Admitting: Family Medicine

## 2019-03-28 DIAGNOSIS — R600 Localized edema: Secondary | ICD-10-CM

## 2019-03-29 ENCOUNTER — Ambulatory Visit
Admission: RE | Admit: 2019-03-29 | Discharge: 2019-03-29 | Disposition: A | Discharge status: Home / Self Care | Payer: BC Managed Care – PPO | Source: Ambulatory Visit | Attending: Family Medicine | Admitting: Family Medicine

## 2019-03-29 DIAGNOSIS — R600 Localized edema: Secondary | ICD-10-CM

## 2019-04-05 ENCOUNTER — Other Ambulatory Visit: Payer: Self-pay

## 2019-04-09 ENCOUNTER — Ambulatory Visit: Payer: BC Managed Care – PPO | Admitting: Endocrinology

## 2019-04-09 ENCOUNTER — Other Ambulatory Visit: Payer: Self-pay

## 2019-04-09 ENCOUNTER — Encounter: Payer: Self-pay | Admitting: Endocrinology

## 2019-04-09 VITALS — BP 144/80 | HR 80 | Ht 73.0 in | Wt 209.4 lb

## 2019-04-09 DIAGNOSIS — E1129 Type 2 diabetes mellitus with other diabetic kidney complication: Secondary | ICD-10-CM | POA: Diagnosis not present

## 2019-04-09 DIAGNOSIS — R809 Proteinuria, unspecified: Secondary | ICD-10-CM | POA: Diagnosis not present

## 2019-04-09 DIAGNOSIS — E119 Type 2 diabetes mellitus without complications: Secondary | ICD-10-CM

## 2019-04-09 LAB — POCT GLYCOSYLATED HEMOGLOBIN (HGB A1C): Hemoglobin A1C: 8.3 % — AB (ref 4.0–5.6)

## 2019-04-09 NOTE — Progress Notes (Signed)
Subjective:    Patient ID: Adam Cardenas, male    DOB: May 02, 1950, 69 y.o.   MRN: [id number]  HPI Pt returns for f/u of diabetes mellitus:  DM type: 2 Dx'ed: 2005 Complications: polyneuropathy and nephropathy.  Therapy: 6 oral meds DKA: never Severe hypoglycemia: never Pancreatitis: once, in 2015, possibly due to [place].   Pancreatic imaging: well-defined ovoid 9 mm hypodensity over the distal body (2015 CT).   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   Interval history: he takes meds as rx'ed.  He brings his meter, with his cbg's which I have reviewed today.  cbg varies from 110-246. pt states he feels well in general.   Past Medical History:  Diagnosis Date  . Cellulitis   . Diabetes mellitus   . HTN (hypertension)   . Hypercholesterolemia   . Seasonal allergic rhinitis   . Sleep apnea     Past Surgical History:  Procedure Laterality Date  . CATARACT EXTRACTION Bilateral   . I & D EXTREMITY Left 11/06/2017   Procedure: IRRIGATION AND DEBRIDEMENT LEG;  Surgeon: Sheral Apley, MD;  Location: [facility] OR;  Service: Orthopedics;  Laterality: Left;  . repair deviated septum      Social History   Socioeconomic History  . Marital status: Unknown    Spouse name: Not on file  . Number of children: Not on file  . Years of education: Not on file  . Highest education level: Not on file  Occupational History  . Occupation: truck Hospital doctor - long haul  Tobacco Use  . Smoking status: Former Smoker    Packs/day: 1.00    Years: 30.00    Pack years: 30.00    Types: Cigarettes    Quit date: 02/08/1998    Years since quitting: 21.1  . Smokeless tobacco: Never Used  Substance and Sexual Activity  . Alcohol use: Yes    Comment: rare-liquor  . Drug use: No  . Sexual activity: Not on file  Other Topics Concern  . Not on file  Social History Narrative  . Not on file   Social Determinants of  Health   Financial Resource Strain:   . Difficulty of Paying Living Expenses: Not on file  Food Insecurity:   . Worried About Programme researcher, broadcasting/film/video in the Last Year: Not on file  . Ran Out of Food in the Last Year: Not on file  Transportation Needs:   . Lack of Transportation (Medical): Not on file  . Lack of Transportation (Non-Medical): Not on file  Physical Activity:   . Days of Exercise per Week: Not on file  . Minutes of Exercise per Session: Not on file  Stress:   . Feeling of Stress : Not on file  Social Connections:   . Frequency of Communication with Friends and Family: Not on file  . Frequency of Social Gatherings with Friends and Family: Not on file  . Attends Religious Services: Not on file  . Active Member of Clubs or Organizations: Not on file  . Attends Banker Meetings: Not on file  . Marital Status: Not on file  Intimate Partner Violence:   . Fear of Current or Ex-Partner: Not on file  . Emotionally Abused: Not on file  . Physically Abused: Not on file  . Sexually Abused: Not on file    Current Outpatient Medications on File Prior to  Visit  Medication Sig Dispense Refill  . acarbose (PRECOSE) 100 MG tablet Take 1 tablet (100 mg total) by mouth 3 (three) times daily with meals. 270 tablet 3  . bromocriptine (PARLODEL) 5 MG capsule TAKE 1 CAPSULE BY MOUTH EVERY DAY 90 capsule 3  . colesevelam (WELCHOL) 625 MG tablet TAKE 3 TABLETS TWICE A DAY WITH MEALS 540 tablet 0  . felodipine (PLENDIL) 5 MG 24 hr tablet Take 5 mg by mouth daily.    . finasteride (PROSCAR) 5 MG tablet Take 5 mg by mouth daily.  11  . glyBURIDE (DIABETA) 5 MG tablet Take 2 tablets (10 mg total) by mouth daily with breakfast. 180 tablet 3  . JARDIANCE 25 MG TABS tablet TAKE 1 TABLET BY MOUTH EVERY DAY 90 tablet 3  . lisinopril-hydrochlorothiazide (PRINZIDE,ZESTORETIC) 20-25 MG tablet Take 2 tablets by mouth daily.     . metFORMIN (GLUCOPHAGE-XR) 500 MG 24 hr tablet TAKE 4 TABLETS BY  MOUTH DAILY. 360 tablet 3  . omeprazole (PRILOSEC) 20 MG capsule Take 20 mg by mouth daily.  3  . simvastatin (ZOCOR) 40 MG tablet Take 40 mg by mouth at bedtime.       No current facility-administered medications on file prior to visit.    Allergies  Allergen Reactions  . 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 Sitagliptin Other (See Comments)    Pancreatitis     Family History  Problem Relation Age of Onset  . Cancer Father   . Diabetes Maternal Grandfather     BP (!) 144/80 (BP Location: Left Arm, Patient Position: Sitting, Cuff Size: Normal)   Pulse 80   Ht 6\' 1"  (1.854 m)   Wt 209 lb 6.4 oz (95 kg)   SpO2 96%   BMI 27.63 kg/m   Review of Systems He denies hypoglycemia.      Objective:   Physical Exam VITAL SIGNS:  See vs page GENERAL: no distress Pulses: dorsalis pedis intact bilat.   MSK: no deformity of the feet CV: 1+ bilat leg edema Skin:  no ulcer on the feet.  normal color and temp on the feet. Neuro: sensation is intact to touch on the feet, but decreased from normal Ext: there is bilateral onychomycosis of the toenails.     Lab Results  Component Value Date   CREATININE 0.95 11/07/2017   BUN 22 11/07/2017   NA 136 11/07/2017   K 3.9 11/07/2017   CL 102 11/07/2017   CO2 26 11/07/2017    Lab Results  Component Value Date   HGBA1C 8.3 (A) 04/09/2019       Assessment & Plan:  HTN: is noted today Type 2 DM, with PN: he needs increased rx Edema: This limits rx options Occupational status: he wants to control DM without insulin.  Patient Instructions  Your blood pressure is high today.  Please see your primary care provider soon, to have it rechecked Please continue the same medications Weight loss is the best way to get your A1c better check your blood sugar once a day.  vary the time of day when you check, between before the 3 meals, and at bedtime.  also check if you  have symptoms of your blood sugar being too high or too low.  please keep a record of the readings and bring it to your next appointment here (or you can bring the meter itself).  You  can write it on any piece of paper.  please call us sooner if your blood sugar goes below 70, or if you have a lot of readings over 200. Please come back for a follow-up appointment in 3 months.

## 2019-04-09 NOTE — Patient Instructions (Addendum)
Your blood pressure is high today.  Please see your primary care provider soon, to have it rechecked Please continue the same medications Weight loss is the best way to get your A1c better check your blood sugar once a day.  vary the time of day when you check, between before the 3 meals, and at bedtime.  also check if you have symptoms of your blood sugar being too high or too low.  please keep a record of the readings and bring it to your next appointment here (or you can bring the meter itself).  You can write it on any piece of paper.  please call us sooner if your blood sugar goes below 70, or if you have a lot of readings over 200. Please come back for a follow-up appointment in 3 months.

## 2019-04-17 ENCOUNTER — Ambulatory Visit: Payer: BC Managed Care – PPO | Attending: Internal Medicine

## 2019-04-17 DIAGNOSIS — Z23 Encounter for immunization: Secondary | ICD-10-CM | POA: Insufficient documentation

## 2019-04-17 NOTE — Progress Notes (Signed)
   Covid-19 Vaccination Clinic  Name:  Adam Cardenas    MRN: [id number] DOB: 04-04-1950  04/17/2019  Adam Cardenas was observed post Covid-19 immunization for 15 minutes without incident. He was provided with Vaccine Information Sheet and instruction to access the V-Safe system.   Adam Cardenas was instructed to call 911 with any severe reactions post vaccine: Marland Kitchen Difficulty breathing  . Swelling of face and throat  . A fast heartbeat  . A bad rash all over body  . Dizziness and weakness   Immunizations Administered    Name Date Dose VIS Date Route   Pfizer COVID-19 Vaccine 04/17/2019 10:50 AM 0.3 mL 01/19/2019 Intramuscular   Manufacturer: [employer], [employer]   Lot: [id number]   NDC: [address]-3

## 2019-05-21 ENCOUNTER — Other Ambulatory Visit: Payer: Self-pay | Admitting: Endocrinology

## 2019-05-21 DIAGNOSIS — E1129 Type 2 diabetes mellitus with other diabetic kidney complication: Secondary | ICD-10-CM

## 2019-05-21 NOTE — Telephone Encounter (Signed)
Is this ok to refill?  

## 2019-05-23 ENCOUNTER — Other Ambulatory Visit: Payer: Self-pay | Admitting: Endocrinology

## 2019-06-15 ENCOUNTER — Other Ambulatory Visit: Payer: Self-pay

## 2019-06-19 ENCOUNTER — Encounter: Payer: Self-pay | Admitting: Endocrinology

## 2019-06-19 ENCOUNTER — Other Ambulatory Visit: Payer: Self-pay

## 2019-06-19 ENCOUNTER — Other Ambulatory Visit: Payer: Self-pay | Admitting: Endocrinology

## 2019-06-19 ENCOUNTER — Ambulatory Visit: Payer: BC Managed Care – PPO | Admitting: Endocrinology

## 2019-06-19 VITALS — BP 110/60 | HR 78 | Ht 73.0 in | Wt 198.0 lb

## 2019-06-19 DIAGNOSIS — E1140 Type 2 diabetes mellitus with diabetic neuropathy, unspecified: Secondary | ICD-10-CM | POA: Diagnosis not present

## 2019-06-19 DIAGNOSIS — R609 Edema, unspecified: Secondary | ICD-10-CM

## 2019-06-19 DIAGNOSIS — E119 Type 2 diabetes mellitus without complications: Secondary | ICD-10-CM

## 2019-06-19 DIAGNOSIS — R809 Proteinuria, unspecified: Secondary | ICD-10-CM | POA: Diagnosis not present

## 2019-06-19 LAB — POCT GLYCOSYLATED HEMOGLOBIN (HGB A1C): Hemoglobin A1C: 7.4 % — AB (ref 4.0–5.6)

## 2019-06-19 MED ORDER — GLYBURIDE 5 MG PO TABS: 5.0000 mg | ORAL_TABLET | Freq: Every day | ORAL | 3 refills | Status: DC

## 2019-06-19 NOTE — Progress Notes (Signed)
Subjective:    Patient ID: Adam Cardenas, male    DOB: 1950-03-23, 69 y.o.   MRN: [id number]  HPI Pt returns for f/u of diabetes mellitus:  DM type: 2 Dx'ed: 7829 Complications: PN and DN.  Therapy: 6 oral meds DKA: never Severe hypoglycemia: never Pancreatitis: once, in 2015, possibly due to [place].   Pancreatic imaging: well-defined ovoid 9 mm hypodensity over the distal body (2015 CT).   SDOH: he needs CDL, in order to work as a Pharmacist, community Other: he has never been on insulin; he did not tolerate pioglitazole (edema).   Interval history: he takes meds as rx'ed.  He brings his meter, with his cbg's which I have reviewed today.  cbg varies from 86-246. pt states he feels well in general.   Past Medical History:  Diagnosis Date  . Cellulitis   . Diabetes mellitus   . HTN (hypertension)   . Hypercholesterolemia   . Seasonal allergic rhinitis   . Sleep apnea     Past Surgical History:  Procedure Laterality Date  . CATARACT EXTRACTION Bilateral   . I & D EXTREMITY Left 11/06/2017   Procedure: IRRIGATION AND DEBRIDEMENT LEG;  Surgeon: Renette Butters, MD;  Location: [place];  Service: Orthopedics;  Laterality: Left;  . repair deviated septum      Social History   Socioeconomic History  . Marital status: Unknown    Spouse name: Not on file  . Number of children: Not on file  . Years of education: Not on file  . Highest education level: Not on file  Occupational History  . Occupation: truck Geophysicist/field seismologist - long haul  Tobacco Use  . Smoking status: Former Smoker    Packs/day: 1.00    Years: 30.00    Pack years: 30.00    Types: Cigarettes    Quit date: 02/08/1998    Years since quitting: 21.3  . Smokeless tobacco: Never Used  Substance and Sexual Activity  . Alcohol use: Yes    Comment: rare-liquor  . Drug use: No  . Sexual activity: Not on file  Other Topics Concern  . Not on file  Social History Narrative  . Not on file   Social Determinants of Health   Financial  Resource Strain:   . Difficulty of Paying Living Expenses:   Food Insecurity:   . Worried About Charity fundraiser in the Last Year:   . Arboriculturist in the Last Year:   Transportation Needs:   . Film/video editor (Medical):   Marland Kitchen Lack of Transportation (Non-Medical):   Physical Activity:   . Days of Exercise per Week:   . Minutes of Exercise per Session:   Stress:   . Feeling of Stress :   Social Connections:   . Frequency of Communication with Friends and Family:   . Frequency of Social Gatherings with Friends and Family:   . Attends Religious Services:   . Active Member of Clubs or Organizations:   . Attends Archivist Meetings:   Marland Kitchen Marital Status:   Intimate Partner Violence:   . Fear of Current or Ex-Partner:   . Emotionally Abused:   Marland Kitchen Physically Abused:   . Sexually Abused:     Current Outpatient Medications on File Prior to Visit  Medication Sig Dispense Refill  . acarbose (PRECOSE) 100 MG tablet Take 1 tablet (100 mg total) by mouth 3 (three) times daily with meals. 270 tablet 3  . bromocriptine (PARLODEL) 5 MG  capsule TAKE 1 CAPSULE BY MOUTH EVERY DAY 90 capsule 3  . colesevelam (WELCHOL) 625 MG tablet TAKE 3 TABLETS BY MOUTH TWICE A DAY WITH MEALS 540 tablet 0  . felodipine (PLENDIL) 5 MG 24 hr tablet Take 5 mg by mouth daily.    . finasteride (PROSCAR) 5 MG tablet Take 5 mg by mouth daily.  11  . lisinopril-hydrochlorothiazide (PRINZIDE,ZESTORETIC) 20-25 MG tablet Take 2 tablets by mouth daily.     Marland Kitchen omeprazole (PRILOSEC) 20 MG capsule Take 20 mg by mouth daily.  3  . simvastatin (ZOCOR) 40 MG tablet Take 40 mg by mouth at bedtime.       No current facility-administered medications on file prior to visit.    Allergies  Allergen Reactions  . 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 Sitagliptin Other (See Comments)    Pancreatitis     Family History  Problem Relation Age of  Onset  . Cancer Father   . Diabetes Maternal Grandfather     BP 110/60   Pulse 78   Ht 6\' 1"  (1.854 m)   Wt 198 lb (89.8 kg)   SpO2 96%   BMI 26.12 kg/m   Review of Systems He has lost 11 lbs since last ov here. He denies hypoglycemia.      Objective:   Physical Exam VITAL SIGNS:  See vs page GENERAL: no distress Pulses: dorsalis pedis intact bilat.   MSK: no deformity of the feet CV: 1+ bilat leg edema Skin:  no ulcer on the feet.  normal color and temp on the feet. Neuro: sensation is intact to touch on the feet, but decreased from normal Ext: there is bilateral onychomycosis of the toenails.    Lab Results  Component Value Date   HGBA1C 7.4 (A) 06/19/2019   Lab Results  Component Value Date   CREATININE 0.95 11/07/2017   BUN 22 11/07/2017   NA 136 11/07/2017   K 3.9 11/07/2017   CL 102 11/07/2017   CO2 26 11/07/2017       Assessment & Plan:  Type 2 DM, with DN: this is the best control this pt should aim for, given this SU-containing regimen. Weight loss.  We discussed.  We agree to reduce SU, in order to facilitate further weight loss Edema: This limits rx options SDOH: he needs to control DM without insulin  Patient Instructions  Please reduce the glyburide to 1 pill per day, and continue the same other medications check your blood sugar once a day.  vary the time of day when you check, between before the 3 meals, and at bedtime.  also check if you have symptoms of your blood sugar being too high or too low.  please keep a record of the readings and bring it to your next appointment here (or you can bring the meter itself).  You can write it on any piece of paper.  please call 11/09/2017 sooner if your blood sugar goes below 70, or if you have a lot of readings over 200. Please come back for a follow-up appointment in 3 months.

## 2019-06-19 NOTE — Patient Instructions (Addendum)
Please reduce the glyburide to 1 pill per day, and continue the same other medications check your blood sugar once a day.  vary the time of day when you check, between before the 3 meals, and at bedtime.  also check if you have symptoms of your blood sugar being too high or too low.  please keep a record of the readings and bring it to your next appointment here (or you can bring the meter itself).  You can write it on any piece of paper.  please call us sooner if your blood sugar goes below 70, or if you have a lot of readings over 200. Please come back for a follow-up appointment in 3 months.

## 2019-07-02 ENCOUNTER — Ambulatory Visit: Payer: BC Managed Care – PPO | Admitting: Endocrinology

## 2019-08-15 ENCOUNTER — Other Ambulatory Visit: Payer: Self-pay | Admitting: Endocrinology

## 2019-08-15 DIAGNOSIS — E1129 Type 2 diabetes mellitus with other diabetic kidney complication: Secondary | ICD-10-CM

## 2019-09-22 ENCOUNTER — Other Ambulatory Visit: Payer: Self-pay | Admitting: Endocrinology

## 2019-09-24 ENCOUNTER — Ambulatory Visit: Payer: BC Managed Care – PPO | Admitting: Endocrinology

## 2019-10-29 ENCOUNTER — Encounter: Payer: Self-pay | Admitting: Endocrinology

## 2019-10-29 ENCOUNTER — Ambulatory Visit: Payer: BC Managed Care – PPO | Admitting: Endocrinology

## 2019-10-29 ENCOUNTER — Other Ambulatory Visit: Payer: Self-pay

## 2019-10-29 VITALS — BP 150/70 | HR 84 | Ht 73.0 in | Wt 196.8 lb

## 2019-10-29 DIAGNOSIS — E119 Type 2 diabetes mellitus without complications: Secondary | ICD-10-CM | POA: Diagnosis not present

## 2019-10-29 DIAGNOSIS — E1129 Type 2 diabetes mellitus with other diabetic kidney complication: Secondary | ICD-10-CM

## 2019-10-29 DIAGNOSIS — R809 Proteinuria, unspecified: Secondary | ICD-10-CM | POA: Diagnosis not present

## 2019-10-29 LAB — POCT GLYCOSYLATED HEMOGLOBIN (HGB A1C): Hemoglobin A1C: 6.4 % — AB (ref 4.0–5.6)

## 2019-10-29 MED ORDER — GLYBURIDE 2.5 MG PO TABS: 2.5000 mg | ORAL_TABLET | Freq: Every day | ORAL | 3 refills | Status: DC

## 2019-10-29 NOTE — Patient Instructions (Addendum)
Your blood pressure is high today.  Please see your primary care provider soon, to have it rechecked Please reduce the glyburide to 2.5 mg per day, and continue the same other diabetes medications.   check your blood sugar once a day.  vary the time of day when you check, between before the 3 meals, and at bedtime.  also check if you have symptoms of your blood sugar being too high or too low.  please keep a record of the readings and bring it to your next appointment here (or you can bring the meter itself).  You can write it on any piece of paper.  please call us sooner if your blood sugar goes below 70, or if you have a lot of readings over 200. Please come back for a follow-up appointment in January.

## 2019-10-29 NOTE — Progress Notes (Signed)
Subjective:    Patient ID: Adam Cardenas, male    DOB: January 03, 1951, 69 y.o.   MRN: [id number]  HPI Pt returns for f/u of diabetes mellitus:  DM type: 2 Dx'ed: 2005 Complications: PN and DN.  Therapy: 6 oral meds DKA: never Severe hypoglycemia: never Pancreatitis: once, in 2015, possibly due to [place].   Pancreatic imaging: well-defined ovoid 9 mm hypodensity over the distal body (2015 CT).   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   Interval history: he takes meds as rx'ed.  Pt says cbg varies from 98-180. pt states he feels well in general.   Past Medical History:  Diagnosis Date  . Cellulitis   . Diabetes mellitus   . HTN (hypertension)   . Hypercholesterolemia   . Seasonal allergic rhinitis   . Sleep apnea     Past Surgical History:  Procedure Laterality Date  . CATARACT EXTRACTION Bilateral   . I & D EXTREMITY Left 11/06/2017   Procedure: IRRIGATION AND DEBRIDEMENT LEG;  Surgeon: Sheral Apley, MD;  Location: [facility];  Service: Orthopedics;  Laterality: Left;  . repair deviated septum      Social History   Socioeconomic History  . Marital status: Unknown    Spouse name: Not on file  . Number of children: Not on file  . Years of education: Not on file  . Highest education level: Not on file  Occupational History  . Occupation: truck Hospital doctor - long haul  Tobacco Use  . Smoking status: Former Smoker    Packs/day: 1.00    Years: 30.00    Pack years: 30.00    Types: Cigarettes    Quit date: 02/08/1998    Years since quitting: 21.7  . Smokeless tobacco: Never Used  Substance and Sexual Activity  . Alcohol use: Yes    Comment: rare-liquor  . Drug use: No  . Sexual activity: Not on file  Other Topics Concern  . Not on file  Social History Narrative  . Not on file   Social Determinants of Health   Financial Resource Strain:   . Difficulty of Paying Living Expenses:  Not on file  Food Insecurity:   . Worried About Programme researcher, broadcasting/film/video in the Last Year: Not on file  . Ran Out of Food in the Last Year: Not on file  Transportation Needs:   . Lack of Transportation (Medical): Not on file  . Lack of Transportation (Non-Medical): Not on file  Physical Activity:   . Days of Exercise per Week: Not on file  . Minutes of Exercise per Session: Not on file  Stress:   . Feeling of Stress : Not on file  Social Connections:   . Frequency of Communication with Friends and Family: Not on file  . Frequency of Social Gatherings with Friends and Family: Not on file  . Attends Religious Services: Not on file  . Active Member of Clubs or Organizations: Not on file  . Attends Banker Meetings: Not on file  . Marital Status: Not on file  Intimate Partner Violence:   . Fear of Current or Ex-Partner: Not on file  . Emotionally Abused: Not on file  . Physically Abused: Not on file  . Sexually Abused: Not on file    Current Outpatient Medications on File Prior to Visit  Medication Sig Dispense Refill  . acarbose (PRECOSE) 100  MG tablet Take 1 tablet (100 mg total) by mouth 3 (three) times daily with meals. 270 tablet 3  . bromocriptine (PARLODEL) 5 MG capsule TAKE 1 CAPSULE BY MOUTH EVERY DAY 90 capsule 3  . colesevelam (WELCHOL) 625 MG tablet TAKE 3 TABLETS BY MOUTH TWICE A DAY WITH MEALS 540 tablet 0  . felodipine (PLENDIL) 5 MG 24 hr tablet Take 5 mg by mouth daily.    . finasteride (PROSCAR) 5 MG tablet Take 5 mg by mouth daily.  11  . JARDIANCE 25 MG TABS tablet TAKE 1 TABLET BY MOUTH EVERY DAY 90 tablet 3  . lisinopril-hydrochlorothiazide (PRINZIDE,ZESTORETIC) 20-25 MG tablet Take 2 tablets by mouth daily.     . metFORMIN (GLUCOPHAGE-XR) 500 MG 24 hr tablet TAKE 4 TABLETS BY MOUTH EVERY DAY 360 tablet 3  . omeprazole (PRILOSEC) 20 MG capsule Take 20 mg by mouth daily.  3  . simvastatin (ZOCOR) 40 MG tablet Take 40 mg by mouth at bedtime.       No  current facility-administered medications on file prior to visit.    Allergies  Allergen Reactions  . 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 Sitagliptin Other (See Comments)    Pancreatitis     Family History  Problem Relation Age of Onset  . Cancer Father   . Diabetes Maternal Grandfather     BP (!) 150/70   Pulse 84   Ht 6\' 1"  (1.854 m)   Wt 196 lb 12.8 oz (89.3 kg)   SpO2 98%   BMI 25.96 kg/m    Review of Systems He has lost a few lbs since last OV here.  He denies hypoglycemia.      Objective:   Physical Exam VITAL SIGNS:  See vs page GENERAL: no distress Pulses: dorsalis pedis intact bilat.   CV: 2+ bilat leg edema Skin:  no ulcer on the feet.  normal color and temp on the feet. Neuro: sensation is intact to touch on the feet, but decreased from normal Ext: there is bilateral onychomycosis of the toenails.    Lab Results  Component Value Date   HGBA1C 6.4 (A) 10/29/2019   Lab Results  Component Value Date   CREATININE 0.95 11/07/2017   BUN 22 11/07/2017   NA 136 11/07/2017   K 3.9 11/07/2017   CL 102 11/07/2017   CO2 26 11/07/2017       Assessment & Plan:  HTN: is noted today Type 2 DM, with PN: overcontrolled, for this SU-containing regimen  Patient Instructions  Your blood pressure is high today.  Please see your primary care provider soon, to have it rechecked Please reduce the glyburide to 2.5 mg per day, and continue the same other diabetes medications.   check your blood sugar once a day.  vary the time of day when you check, between before the 3 meals, and at bedtime.  also check if you have symptoms of your blood sugar being too high or too low.  please keep a record of the readings and bring it to your next appointment here (or you can bring the meter itself).  You can write it on any piece of paper.  please call 11/09/2017 sooner if your blood sugar goes below 70, or if you have a  lot of readings over 200. Please come back for a follow-up appointment in January.

## 2019-11-08 ENCOUNTER — Other Ambulatory Visit: Payer: Self-pay | Admitting: Endocrinology

## 2019-11-08 DIAGNOSIS — E1129 Type 2 diabetes mellitus with other diabetic kidney complication: Secondary | ICD-10-CM

## 2019-12-24 DIAGNOSIS — N529 Male erectile dysfunction, unspecified: Secondary | ICD-10-CM | POA: Insufficient documentation

## 2020-03-03 ENCOUNTER — Ambulatory Visit: Payer: BC Managed Care – PPO | Admitting: Endocrinology

## 2020-04-10 ENCOUNTER — Other Ambulatory Visit: Payer: Self-pay

## 2020-04-14 ENCOUNTER — Other Ambulatory Visit: Payer: Self-pay

## 2020-04-14 ENCOUNTER — Ambulatory Visit: Payer: BC Managed Care – PPO | Admitting: Endocrinology

## 2020-04-14 VITALS — BP 140/54 | HR 82 | Ht 73.0 in | Wt 206.4 lb

## 2020-04-14 DIAGNOSIS — E1129 Type 2 diabetes mellitus with other diabetic kidney complication: Secondary | ICD-10-CM | POA: Diagnosis not present

## 2020-04-14 DIAGNOSIS — R809 Proteinuria, unspecified: Secondary | ICD-10-CM

## 2020-04-14 DIAGNOSIS — E119 Type 2 diabetes mellitus without complications: Secondary | ICD-10-CM

## 2020-04-14 LAB — POCT GLYCOSYLATED HEMOGLOBIN (HGB A1C): Hemoglobin A1C: 7.4 % — AB (ref 4.0–5.6)

## 2020-04-14 NOTE — Progress Notes (Signed)
Subjective:    Patient ID: Adam Cardenas, male    DOB: 05-Jul-1950, 70 y.o.   MRN: [id number]  HPI Pt returns for f/u of diabetes mellitus:  DM type: 2 Dx'ed: 2005 Complications: PN and DN.  Therapy: 6 oral meds DKA: never Severe hypoglycemia: never Pancreatitis: once, in 2015, possibly due to [place].   Pancreatic imaging: well-defined ovoid 9 mm hypodensity over the distal body (2015 CT).   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   Interval history: he says he sometimes misses meds, despite low copays.  Pt says cbg's are in the 100's. pt states he feels well in general.   Past Medical History:  Diagnosis Date  . Cellulitis   . Diabetes mellitus   . HTN (hypertension)   . Hypercholesterolemia   . Seasonal allergic rhinitis   . Sleep apnea     Past Surgical History:  Procedure Laterality Date  . CATARACT EXTRACTION Bilateral   . I & D EXTREMITY Left 11/06/2017   Procedure: IRRIGATION AND DEBRIDEMENT LEG;  Surgeon: Sheral Apley, MD;  Location: [facility] OR;  Service: Orthopedics;  Laterality: Left;  . repair deviated septum      Social History   Socioeconomic History  . Marital status: Unknown    Spouse name: Not on file  . Number of children: Not on file  . Years of education: Not on file  . Highest education level: Not on file  Occupational History  . Occupation: truck Hospital doctor - long haul  Tobacco Use  . Smoking status: Former Smoker    Packs/day: 1.00    Years: 30.00    Pack years: 30.00    Types: Cigarettes    Quit date: 02/08/1998    Years since quitting: 22.1  . Smokeless tobacco: Never Used  Substance and Sexual Activity  . Alcohol use: Yes    Comment: rare-liquor  . Drug use: No  . Sexual activity: Not on file  Other Topics Concern  . Not on file  Social History Narrative  . Not on file   Social Determinants of Health   Financial Resource Strain: Not on file   Food Insecurity: Not on file  Transportation Needs: Not on file  Physical Activity: Not on file  Stress: Not on file  Social Connections: Not on file  Intimate Partner Violence: Not on file    Current Outpatient Medications on File Prior to Visit  Medication Sig Dispense Refill  . acarbose (PRECOSE) 100 MG tablet Take 1 tablet (100 mg total) by mouth 3 (three) times daily with meals. 270 tablet 3  . bromocriptine (PARLODEL) 5 MG capsule TAKE 1 CAPSULE BY MOUTH EVERY DAY 90 capsule 3  . colesevelam (WELCHOL) 625 MG tablet TAKE 3 TABLETS BY MOUTH TWICE A DAY WITH MEALS 540 tablet 0  . felodipine (PLENDIL) 5 MG 24 hr tablet Take 5 mg by mouth daily.    . finasteride (PROSCAR) 5 MG tablet Take 5 mg by mouth daily.  11  . glyBURIDE (DIABETA) 2.5 MG tablet Take 1 tablet (2.5 mg total) by mouth daily with breakfast. 90 tablet 3  . JARDIANCE 25 MG TABS tablet TAKE 1 TABLET BY MOUTH EVERY DAY 90 tablet 3  . lisinopril-hydrochlorothiazide (PRINZIDE,ZESTORETIC) 20-25 MG tablet Take 2 tablets by mouth daily.     . metFORMIN (GLUCOPHAGE-XR) 500 MG 24 hr tablet TAKE 4 TABLETS BY MOUTH EVERY DAY  360 tablet 3  . metoprolol succinate (TOPROL-XL) 25 MG 24 hr tablet Take 25 mg by mouth daily.    Marland Kitchen omeprazole (PRILOSEC) 20 MG capsule Take 20 mg by mouth daily.  3  . simvastatin (ZOCOR) 40 MG tablet Take 40 mg by mouth at bedtime.    . tadalafil (CIALIS) 20 MG tablet 1 tablet     No current facility-administered medications on file prior to visit.    Allergies  Allergen Reactions  . 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 Sitagliptin Other (See Comments)    Pancreatitis     Family History  Problem Relation Age of Onset  . Cancer Father   . Diabetes Maternal Grandfather     BP (!) 140/54 (BP Location: Right Arm, Patient Position: Sitting, Cuff Size: Normal)   Pulse 82   Ht 6\' 1"  (1.854 m)   Wt 206 lb 6.4 oz (93.6 kg)   SpO2  97%   BMI 27.23 kg/m    Review of Systems He denies hypoglycemia.      Objective:   Physical Exam VITAL SIGNS:  See vs page GENERAL: no distress Pulses: dorsalis pedis intact bilat.   CV: 2+ bilat leg edema Skin:  no ulcer on the feet.  normal color and temp on the feet. Neuro: sensation is intact to touch on the feet, but decreased from normal Ext: there is bilateral onychomycosis of the toenails.   Lab Results  Component Value Date   HGBA1C 7.4 (A) 04/14/2020   Lab Results  Component Value Date   CREATININE 0.95 11/07/2017   BUN 22 11/07/2017   NA 136 11/07/2017   K 3.9 11/07/2017   CL 102 11/07/2017   CO2 26 11/07/2017      Assessment & Plan:  Type 2 DM: uncontrolled Pancreatitis: due to this hx, need to keep CDL, and limited oral options, plan is to continue same rx.   Patient Instructions  Please continue the same 6 diabetes medications.   check your blood sugar once a day.  vary the time of day when you check, between before the 3 meals, and at bedtime.  also check if you have symptoms of your blood sugar being too high or too low.  please keep a record of the readings and bring it to your next appointment here (or you can bring the meter itself).  You can write it on any piece of paper.  please call 11/09/2017 sooner if your blood sugar goes below 70, or if you have a lot of readings over 200.   Please come back for a follow-up appointment in 4 months.

## 2020-04-14 NOTE — Patient Instructions (Addendum)
Please continue the same 6 diabetes medications.    check your blood sugar once a day.  vary the time of day when you check, between before the 3 meals, and at bedtime.  also check if you have symptoms of your blood sugar being too high or too low.  please keep a record of the readings and bring it to your next appointment here (or you can bring the meter itself).  You can write it on any piece of paper.  please call us sooner if your blood sugar goes below 70, or if you have a lot of readings over 200.   Please come back for a follow-up appointment in 4 months.    

## 2020-05-20 IMAGING — DX DG TIBIA/FIBULA 2V*L*
2 series · 2 of 2 positions shown · non-contrast
Comparison: None.

CLINICAL DATA: Status post fall with left leg pain.

EXAM:
LEFT TIBIA AND FIBULA - 2 VIEW

[x tib-fib ap left]
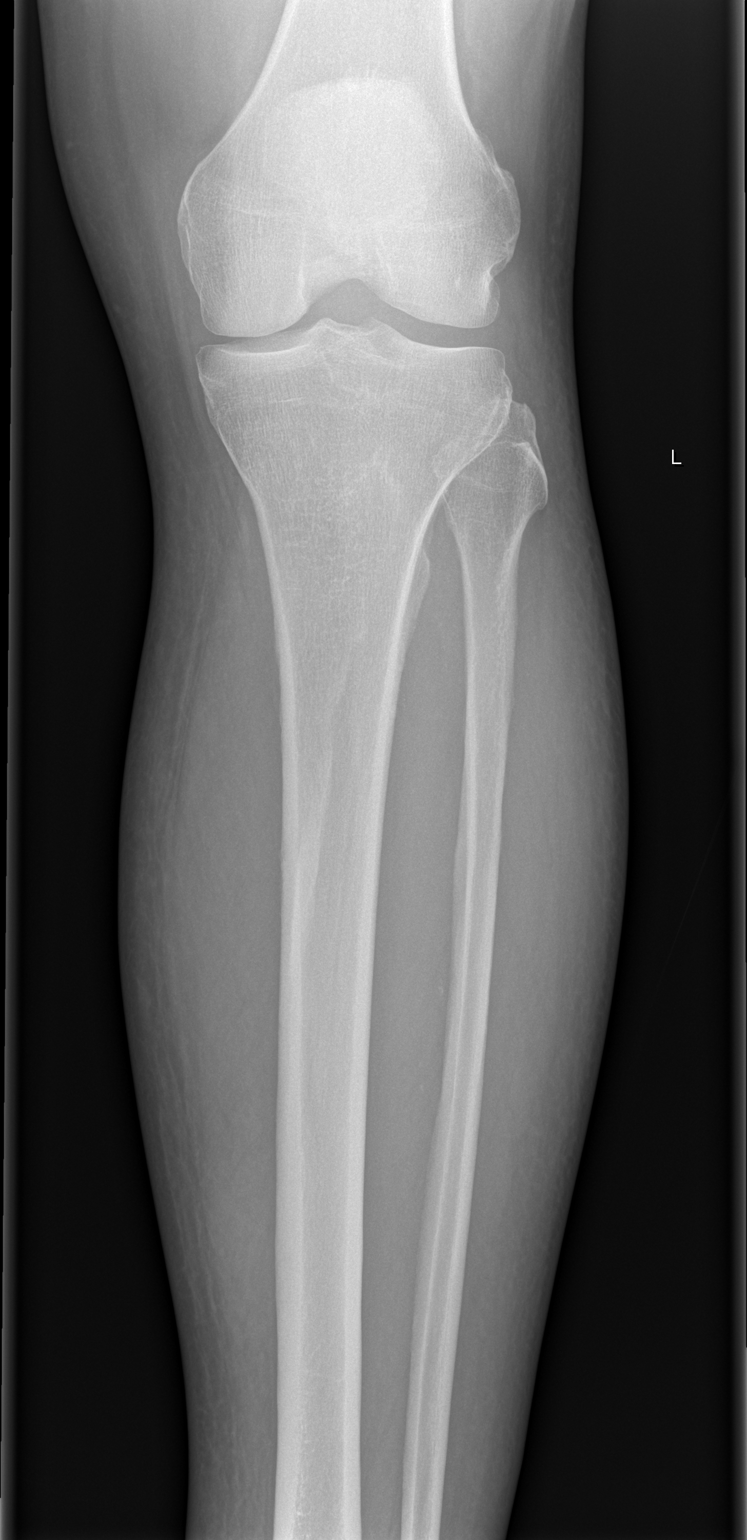

[x tib-fib lat left]
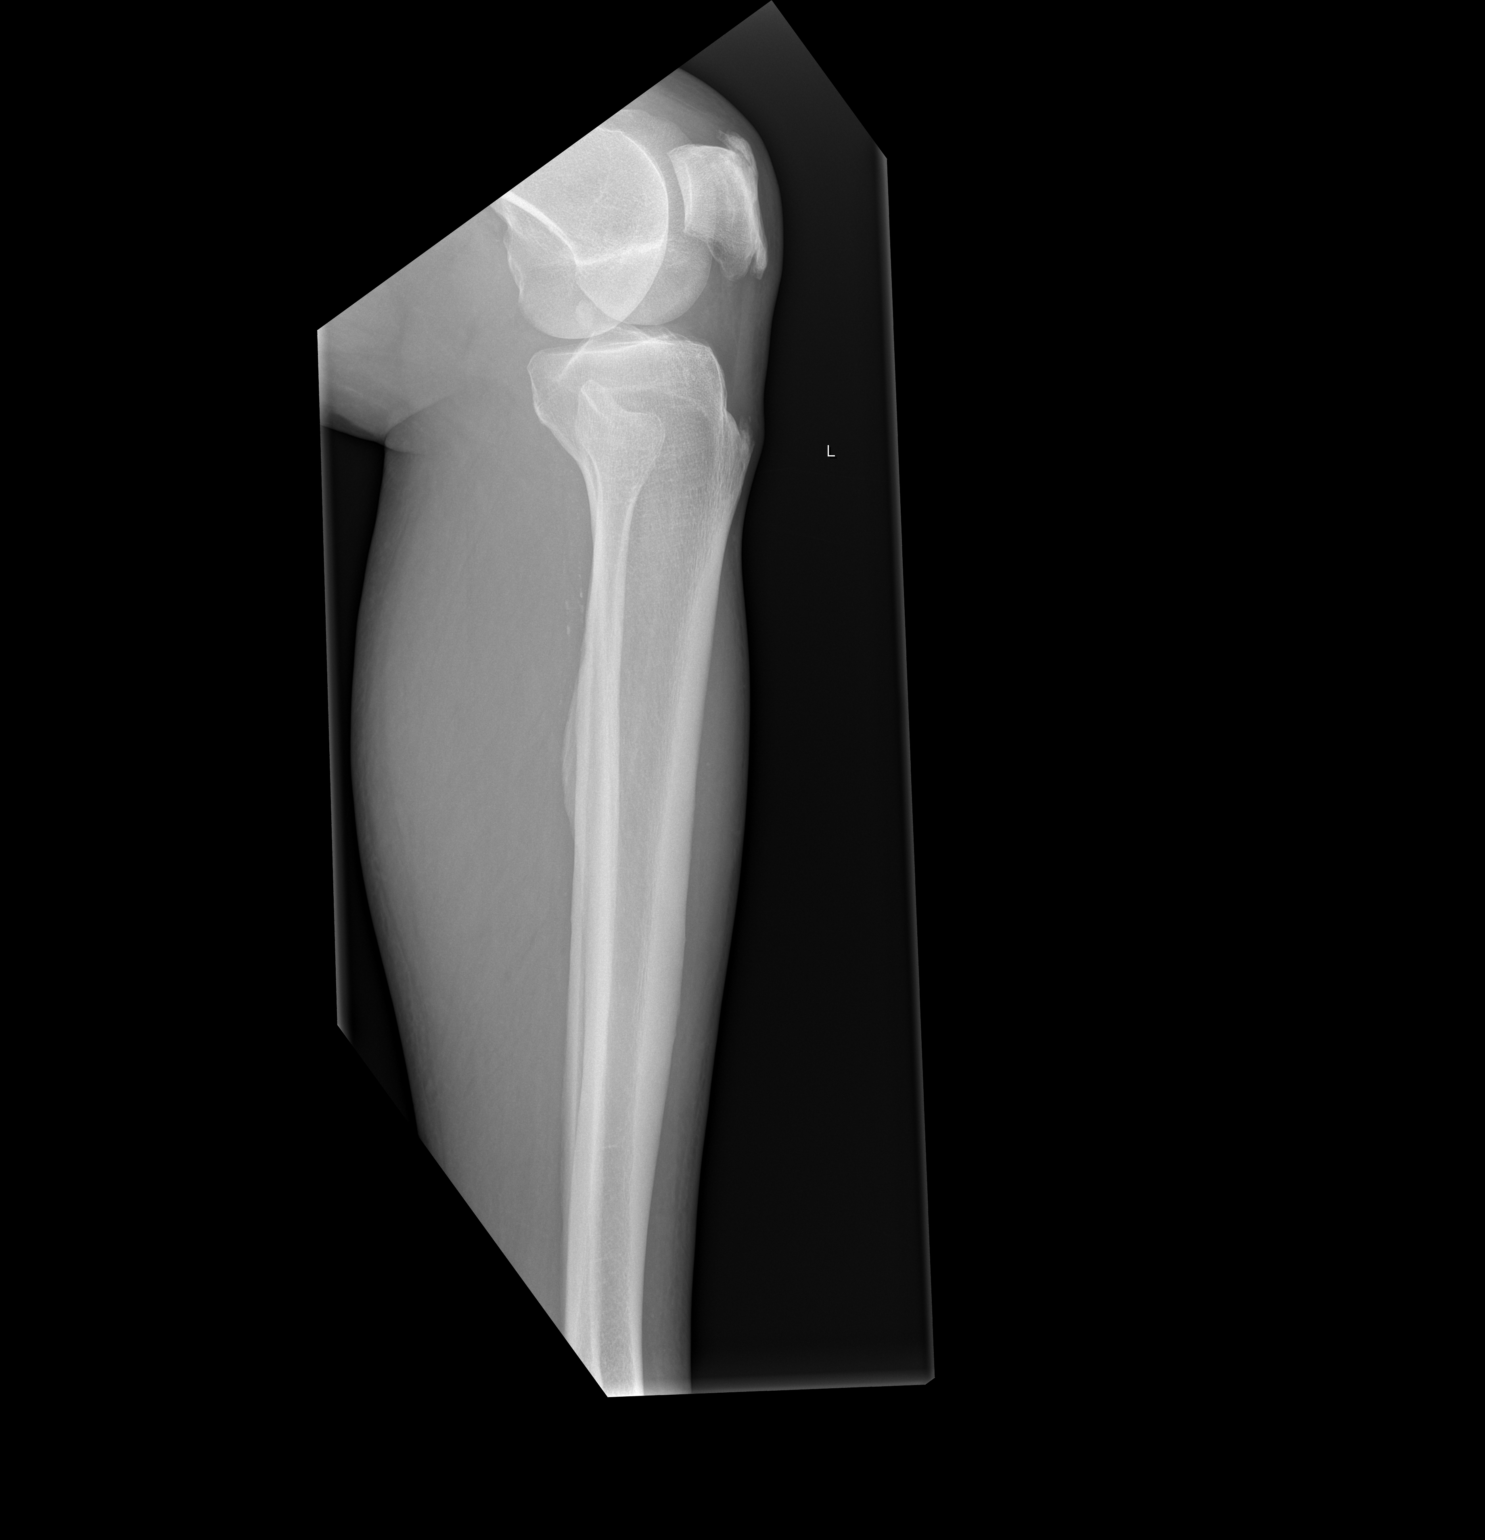

[2 of 2 positions shown; findings below may reference images not displayed]

FINDINGS: There is no evidence of fracture or other focal bone lesions. Soft
tissues are unremarkable.
IMPRESSION: Negative.

## 2020-06-06 ENCOUNTER — Other Ambulatory Visit: Payer: Self-pay | Admitting: Endocrinology

## 2020-06-15 ENCOUNTER — Other Ambulatory Visit: Payer: Self-pay | Admitting: Endocrinology

## 2020-06-27 ENCOUNTER — Other Ambulatory Visit: Payer: Self-pay

## 2020-06-27 ENCOUNTER — Ambulatory Visit: Payer: BC Managed Care – PPO | Admitting: Endocrinology

## 2020-06-27 VITALS — BP 130/54 | HR 74 | Ht 73.0 in | Wt 208.4 lb

## 2020-06-27 DIAGNOSIS — E1129 Type 2 diabetes mellitus with other diabetic kidney complication: Secondary | ICD-10-CM | POA: Diagnosis not present

## 2020-06-27 DIAGNOSIS — R809 Proteinuria, unspecified: Secondary | ICD-10-CM

## 2020-06-27 DIAGNOSIS — E119 Type 2 diabetes mellitus without complications: Secondary | ICD-10-CM | POA: Diagnosis not present

## 2020-06-27 LAB — POCT GLYCOSYLATED HEMOGLOBIN (HGB A1C): Hemoglobin A1C: 8 % — AB (ref 4.0–5.6)

## 2020-06-27 MED ORDER — REPAGLINIDE 2 MG PO TABS: 2.0000 mg | ORAL_TABLET | Freq: Two times a day (BID) | ORAL | 3 refills | Status: DC

## 2020-06-27 NOTE — Patient Instructions (Addendum)
I have sent a prescription to your pharmacy, to change glyburide to repaglinide. Please continue the same other 5 diabetes medications.   check your blood sugar once a day.  vary the time of day when you check, between before the 3 meals, and at bedtime.  also check if you have symptoms of your blood sugar being too high or too low.  please keep a record of the readings and bring it to your next appointment here (or you can bring the meter itself).  You can write it on any piece of paper.  please call us sooner if your blood sugar goes below 70, or if you have a lot of readings over 200.   Please come back for a follow-up appointment in 4 months.

## 2020-06-27 NOTE — Progress Notes (Signed)
Subjective:    Patient ID: Adam Cardenas, male    DOB: 1950-03-06, 70 y.o.   MRN: [id number]  HPI Pt returns for f/u of diabetes mellitus:  DM type: 2 Dx'ed: 2005 Complications: PN and DN.  Therapy: 6 oral meds DKA: never Severe hypoglycemia: never Pancreatitis: once, in 2015, possibly due to Januvia.   Pancreatic imaging: well-defined ovoid 9 mm hypodensity over the distal body (2015 CT).   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   Interval history: he says he sometimes misses meds, despite low copays.  Pt says cbg's are in the 100's. pt states he feels well in general. Past Medical History:  Diagnosis Date  . Cellulitis   . Diabetes mellitus   . HTN (hypertension)   . Hypercholesterolemia   . Seasonal allergic rhinitis   . Sleep apnea     Past Surgical History:  Procedure Laterality Date  . CATARACT EXTRACTION Bilateral   . I & D EXTREMITY Left 11/06/2017   Procedure: IRRIGATION AND DEBRIDEMENT LEG;  Surgeon: Sheral Apley, MD;  Location: [facility] OR;  Service: Orthopedics;  Laterality: Left;  . repair deviated septum      Social History   Socioeconomic History  . Marital status: Unknown    Spouse name: Not on file  . Number of children: Not on file  . Years of education: Not on file  . Highest education level: Not on file  Occupational History  . Occupation: truck Hospital doctor - long haul  Tobacco Use  . Smoking status: Former Smoker    Packs/day: 1.00    Years: 30.00    Pack years: 30.00    Types: Cigarettes    Quit date: 02/08/1998    Years since quitting: 22.4  . Smokeless tobacco: Never Used  Substance and Sexual Activity  . Alcohol use: Yes    Comment: rare-liquor  . Drug use: No  . Sexual activity: Not on file  Other Topics Concern  . Not on file  Social History Narrative  . Not on file   Social Determinants of Health   Financial Resource Strain: Not on file   Food Insecurity: Not on file  Transportation Needs: Not on file  Physical Activity: Not on file  Stress: Not on file  Social Connections: Not on file  Intimate Partner Violence: Not on file    Current Outpatient Medications on File Prior to Visit  Medication Sig Dispense Refill  . acarbose (PRECOSE) 100 MG tablet Take 1 tablet (100 mg total) by mouth 3 (three) times daily with meals. 270 tablet 3  . bromocriptine (PARLODEL) 5 MG capsule TAKE 1 CAPSULE BY MOUTH EVERY DAY 90 capsule 3  . colesevelam (WELCHOL) 625 MG tablet TAKE 3 TABLETS BY MOUTH TWICE A DAY WITH MEALS 540 tablet 0  . felodipine (PLENDIL) 5 MG 24 hr tablet Take 5 mg by mouth daily.    . finasteride (PROSCAR) 5 MG tablet Take 5 mg by mouth daily.  11  . JARDIANCE 25 MG TABS tablet TAKE 1 TABLET BY MOUTH EVERY DAY 90 tablet 3  . lisinopril-hydrochlorothiazide (PRINZIDE,ZESTORETIC) 20-25 MG tablet Take 2 tablets by mouth daily.     . metFORMIN (GLUCOPHAGE-XR) 500 MG 24 hr tablet TAKE 4 TABLETS BY MOUTH EVERY DAY 360 tablet 3  . metoprolol succinate (TOPROL-XL) 25 MG 24 hr tablet Take 25 mg by mouth daily.    Marland Kitchen  omeprazole (PRILOSEC) 20 MG capsule Take 20 mg by mouth daily.  3  . simvastatin (ZOCOR) 40 MG tablet Take 40 mg by mouth at bedtime.    . tadalafil (CIALIS) 20 MG tablet 1 tablet     No current facility-administered medications on file prior to visit.    Allergies  Allergen Reactions  . 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 Sitagliptin Other (See Comments)    Pancreatitis     Family History  Problem Relation Age of Onset  . Cancer Father   . Diabetes Maternal Grandfather     BP (!) 130/54 (BP Location: Right Arm, Patient Position: Sitting, Cuff Size: Normal)   Pulse 74   Ht 6\' 1"  (1.854 m)   Wt 208 lb 6.4 oz (94.5 kg)   SpO2 98%   BMI 27.50 kg/m    Review of Systems     Objective:   Physical Exam VITAL SIGNS:  See vs  page GENERAL: no distress.  Pulses: dorsalis pedis intact bilat.   MSK: no deformity of the feet CV: 1+ bilat leg edema.  Skin:  no ulcer on the feet.  normal color and temp on the feet.  Neuro: sensation is intact to touch on the feet.    Lab Results  Component Value Date   CREATININE 0.95 11/07/2017   BUN 22 11/07/2017   NA 136 11/07/2017   K 3.9 11/07/2017   CL 102 11/07/2017   CO2 26 11/07/2017    Lab Results  Component Value Date   HGBA1C 8.0 (A) 06/27/2020       Assessment & Plan:  Type 2 DM: uncontrolled.  In order to safely improve A1c, we'll have to d/c SU rx.  Patient Instructions  I have sent a prescription to your pharmacy, to change glyburide to repaglinide. Please continue the same other 5 diabetes medications.   check your blood sugar once a day.  vary the time of day when you check, between before the 3 meals, and at bedtime.  also check if you have symptoms of your blood sugar being too high or too low.  please keep a record of the readings and bring it to your next appointment here (or you can bring the meter itself).  You can write it on any piece of paper.  please call 06/29/2020 sooner if your blood sugar goes below 70, or if you have a lot of readings over 200.   Please come back for a follow-up appointment in 4 months.

## 2020-07-05 ENCOUNTER — Other Ambulatory Visit: Payer: Self-pay | Admitting: Endocrinology

## 2020-08-18 ENCOUNTER — Ambulatory Visit: Payer: BC Managed Care – PPO | Admitting: Endocrinology

## 2020-09-29 ENCOUNTER — Other Ambulatory Visit: Payer: Self-pay | Admitting: Endocrinology

## 2020-11-03 ENCOUNTER — Ambulatory Visit: Payer: BC Managed Care – PPO | Admitting: Endocrinology

## 2021-03-02 ENCOUNTER — Ambulatory Visit: Payer: BC Managed Care – PPO | Admitting: Endocrinology

## 2021-03-02 DIAGNOSIS — D369 Benign neoplasm, unspecified site: Secondary | ICD-10-CM | POA: Insufficient documentation

## 2021-03-09 ENCOUNTER — Other Ambulatory Visit: Payer: Self-pay | Admitting: Endocrinology

## 2021-04-04 ENCOUNTER — Other Ambulatory Visit: Payer: Self-pay | Admitting: Endocrinology

## 2021-05-10 ENCOUNTER — Other Ambulatory Visit: Payer: Self-pay | Admitting: Endocrinology

## 2021-06-22 ENCOUNTER — Ambulatory Visit: Payer: BC Managed Care – PPO | Admitting: Internal Medicine

## 2021-06-22 ENCOUNTER — Encounter: Payer: Self-pay | Admitting: Internal Medicine

## 2021-06-22 ENCOUNTER — Ambulatory Visit: Payer: BC Managed Care – PPO | Admitting: Endocrinology

## 2021-06-22 VITALS — BP 134/80 | HR 84 | Ht 73.0 in | Wt 197.2 lb

## 2021-06-22 DIAGNOSIS — R739 Hyperglycemia, unspecified: Secondary | ICD-10-CM | POA: Diagnosis not present

## 2021-06-22 DIAGNOSIS — E1129 Type 2 diabetes mellitus with other diabetic kidney complication: Secondary | ICD-10-CM

## 2021-06-22 DIAGNOSIS — E785 Hyperlipidemia, unspecified: Secondary | ICD-10-CM

## 2021-06-22 DIAGNOSIS — E1165 Type 2 diabetes mellitus with hyperglycemia: Secondary | ICD-10-CM | POA: Diagnosis not present

## 2021-06-22 DIAGNOSIS — R809 Proteinuria, unspecified: Secondary | ICD-10-CM

## 2021-06-22 DIAGNOSIS — E1142 Type 2 diabetes mellitus with diabetic polyneuropathy: Secondary | ICD-10-CM | POA: Diagnosis not present

## 2021-06-22 LAB — POCT GLYCOSYLATED HEMOGLOBIN (HGB A1C): Hemoglobin A1C: 7.6 % — AB (ref 4.0–5.6)

## 2021-06-22 MED ORDER — METFORMIN HCL ER 500 MG PO TB24: 2000.0000 mg | ORAL_TABLET | Freq: Every day | ORAL | 3 refills | Status: DC

## 2021-06-22 MED ORDER — COLESEVELAM HCL 625 MG PO TABS: 1875.0000 mg | ORAL_TABLET | Freq: Two times a day (BID) | ORAL | 2 refills | Status: DC

## 2021-06-22 MED ORDER — REPAGLINIDE 2 MG PO TABS: 2.0000 mg | ORAL_TABLET | Freq: Two times a day (BID) | ORAL | 3 refills | Status: DC

## 2021-06-22 MED ORDER — EMPAGLIFLOZIN 25 MG PO TABS: 25.0000 mg | ORAL_TABLET | Freq: Every day | ORAL | 3 refills | Status: DC

## 2021-06-22 NOTE — Progress Notes (Signed)
?Name: Adam Cardenas  ?Age/ Sex: 71 y.o., male   ?MRN/ DOB: [id number], [birth year]    ? ?PCP: London Pepper, MD   ?Reason for Endocrinology Evaluation: Type 2 Diabetes Mellitus  ?Initial Endocrine Consultative Visit: 02/21/2017  ? ? ?PATIENT IDENTIFIER: Adam Cardenas is a 71 y.o. male with a past medical history of T2DM,Hx of pancreatitis (questionable relation to Januvia). The patient has followed with Endocrinology clinic since 02/21/2017 for consultative assistance with management of his diabetes. ? ?DIABETIC HISTORY:  ?Adam Cardenas was diagnosed with DM 2005, he has history of pancreatitis that may have been attributed to Januvia, pioglitazone caused LE edema . His hemoglobin A1c has ranged from 6.4% in 2021, peaking at 9.6% in 2020. ? ? ?SUBJECTIVE:  ? ?During the last visit (06/27/2020): Saw Dr. Loanne Drilling ? ?Today (06/22/2021): Adam Cardenas is here for follow-up on diabetes management.  He checks his blood sugars 1 times daily. The patient has not had hypoglycemic episodes since the last clinic visit. ? ? ?He is S/P coloscopies and intestine sx  ?Schedule for DOT physical next week  ?Works at night 10:30 pm till 7 am  ?He eats 10:30 pm 2 baked chicken breast, cheery tomatoes, 2:30 am , lunch meal and pickles and cheese , sweet potatoes but no bread.  ?Snacks on raw nuts  ? ?Has been out of Acarbose and bromocriptine for a while  ? ?He has sensation loss in the feet  ? ? ? ?HOME DIABETES REGIMEN:  ?Acarbose 100 mg 3 times daily- not taking  ?Bromocriptine 5 mg daily- -not taking  ?WelChol 625, 3 tabs twice daily ?Jardiance 25 mg daily ?Repaglinide 2 mg BID   ?Metformin 500 mg XR 4 tabs in the evenings  ? ? ? ?Statin: Yes ?ACE-I/ARB: Yes ? ? ? ?METER DOWNLOAD SUMMARY: unable to download  ?124-345 mg/dL  ? ?DIABETIC COMPLICATIONS: ?Microvascular complications:  ? ?Denies: CKD, neuropathy , retinopathy  ?Last Eye Exam: Completed 2021 ? ?Macrovascular complications:  ? ?Denies: CAD, CVA, PVD ? ? ?HISTORY:  ?Past Medical  History:  ?Past Medical History:  ?Diagnosis Date  ? Cellulitis   ? Diabetes mellitus   ? HTN (hypertension)   ? Hypercholesterolemia   ? Seasonal allergic rhinitis   ? Sleep apnea   ? ?Past Surgical History:  ?Past Surgical History:  ?Procedure Laterality Date  ? CATARACT EXTRACTION Bilateral   ? I & D EXTREMITY Left 11/06/2017  ? Procedure: IRRIGATION AND DEBRIDEMENT LEG;  Surgeon: Renette Butters, MD;  Location: [facility];  Service: Orthopedics;  Laterality: Left;  ? repair deviated septum    ? ?Social History:  reports that he quit smoking about 23 years ago. His smoking use included cigarettes. He has a 30.00 pack-year smoking history. He has never used smokeless tobacco. He reports current alcohol use. He reports that he does not use drugs. ?Family History:  ?Family History  ?Problem Relation Age of Onset  ? Cancer Father   ? Diabetes Maternal Grandfather   ? ? ? ?HOME MEDICATIONS: ?Allergies as of 06/22/2021   ? ?   Reactions  ? Insulins Other (See Comments)  ? 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 Sitagliptin Other (See Comments)  ? Pancreatitis  ? ?  ? ?  ?Medication List  ?  ? ?  ? Accurate as of Jun 22, 2021  8:49 AM. If you have any questions, ask your nurse or doctor.  ?  ?  ? ?  ? ?  acarbose 50 MG tablet ?Commonly known as: PRECOSE ?Take by mouth. ?  ?acarbose 100 MG tablet ?Commonly known as: PRECOSE ?Take 1 tablet (100 mg total) by mouth 3 (three) times daily with meals. ?  ?bromocriptine 5 MG capsule ?Commonly known as: PARLODEL ?TAKE 1 CAPSULE BY MOUTH EVERY DAY ?  ?colesevelam 625 MG tablet ?Commonly known as: WELCHOL ?TAKE 3 TABLETS BY MOUTH TWICE A DAY WITH MEALS ?  ?felodipine 5 MG 24 hr tablet ?Commonly known as: PLENDIL ?Take 5 mg by mouth daily. ?  ?finasteride 5 MG tablet ?Commonly known as: PROSCAR ?Take 5 mg by mouth daily. ?  ?Jardiance 25 MG Tabs tablet ?Generic drug: empagliflozin ?TAKE 1 TABLET BY MOUTH EVERY DAY ?  ?lisinopril-hydrochlorothiazide 20-25  MG tablet ?Commonly known as: ZESTORETIC ?Take 2 tablets by mouth daily. ?  ?metFORMIN 500 MG 24 hr tablet ?Commonly known as: GLUCOPHAGE-XR ?TAKE 4 TABLETS BY MOUTH EVERY DAY ?  ?metoprolol succinate 25 MG 24 hr tablet ?Commonly known as: TOPROL-XL ?Take 25 mg by mouth daily. ?  ?omeprazole 20 MG capsule ?Commonly known as: PRILOSEC ?Take 20 mg by mouth daily. ?  ?repaglinide 2 MG tablet ?Commonly known as: PRANDIN ?TAKE 1 TABLET BY MOUTH 2 TIMES DAILY BEFORE A MEAL. ?  ?simvastatin 40 MG tablet ?Commonly known as: ZOCOR ?Take 40 mg by mouth at bedtime. ?  ?tadalafil 20 MG tablet ?Commonly known as: CIALIS ?1 tablet ?  ? ?  ? ? ? ?OBJECTIVE:  ? ?Vital Signs: BP 134/80 (BP Location: Left Arm, Patient Position: Sitting, Cuff Size: Small)   Pulse 84   Ht 6\' 1"  (1.854 m)   Wt 197 lb 3.2 oz (89.4 kg)   SpO2 99%   BMI 26.02 kg/m?   ?Wt Readings from Last 3 Encounters:  ?06/22/21 197 lb 3.2 oz (89.4 kg)  ?06/27/20 208 lb 6.4 oz (94.5 kg)  ?04/14/20 206 lb 6.4 oz (93.6 kg)  ? ? ? ?Exam: ?General: Pt appears well and is in NAD  ?Neck: General: Supple without adenopathy. ?Thyroid: Thyroid size normal.  No goiter or nodules appreciated.  ?Lungs: Clear with good BS bilat with no rales, rhonchi, or wheezes  ?Heart: RRR with normal S1 and S2 and no gallops; no murmurs; no rub  ?Abdomen: Normoactive bowel sounds, soft, nontender, without masses or organomegaly palpable  ?Extremities: No pretibial edema.   ?Neuro: MS is good with appropriate affect, pt is alert and Ox3  ? ? ?DM foot exam: 06/22/2021 ? ?The skin of the feet shows a callus formation at the right 1st MT head ?The pedal pulses are 2+ on right and 2+ on left. ?The sensation is decreased  to a screening 5.07, 10 gram monofilament bilaterally, worse on the right ? ?   ? ? ? ?DATA REVIEWED: ? ?Lab Results  ?Component Value Date  ? HGBA1C 7.6 (A) 06/22/2021  ? HGBA1C 8.0 (A) 06/27/2020  ? HGBA1C 7.4 (A) 04/14/2020  ? ?Lab Results  ?Component Value Date  ? CREATININE  0.95 11/07/2017  ? ? Latest Reference Range & Units 06/22/21 09:25  ?Sodium 135 - 145 mEq/L 136  ?Potassium 3.5 - 5.1 mEq/L 4.3  ?Chloride 96 - 112 mEq/L 101  ?CO2 19 - 32 mEq/L 26  ?Glucose 70 - 99 mg/dL 157 (H)  ?BUN 6 - 23 mg/dL 24 (H)  ?Creatinine 0.40 - 1.50 mg/dL 1.03  ?Calcium 8.4 - 10.5 mg/dL 9.8  ?Alkaline Phosphatase 39 - 117 U/L 53  ?Albumin 3.5 - 5.2 g/dL 4.2  ?  AST 0 - 37 U/L 14  ?ALT 0 - 53 U/L 16  ?Total Protein 6.0 - 8.3 g/dL 6.5  ?Total Bilirubin 0.2 - 1.2 mg/dL 0.4  ?GFR >60.00 mL/min 72.67  ?Total CHOL/HDL Ratio  3  ?Cholesterol 0 - 200 mg/dL 110  ?HDL Cholesterol >39.00 mg/dL 41.40  ?LDL (calc) 0 - 99 mg/dL 37  ?MICROALB/CREAT RATIO 0.0 - 30.0 mg/g 8.5  ?NonHDL  68.62  ?Triglycerides 0.0 - 149.0 mg/dL 160.0 (H)  ?VLDL 0.0 - 40.0 mg/dL 32.0  ? ? Latest Reference Range & Units 06/22/21 09:25  ?Creatinine,U mg/dL 27.2  ?Microalb, Ur 0.0 - 1.9 mg/dL 2.3 (H)  ?MICROALB/CREAT RATIO 0.0 - 30.0 mg/g 8.5  ? ? ? ? ?ASSESSMENT / PLAN / RECOMMENDATIONS:  ? ?1) Type 2 Diabetes Mellitus, Sub-Optimally controlled, With neuropathic complications - Most recent A1c of 7.6 %. Goal A1c < 7.0 %.   ? ? ?-Patient has made lifestyle changes which has improved his A1c, currently at 7.6% ?-He was printed A1c result to take to DOT physical next week ?-He has been out of bromocriptine and a carbose, we will discontinue dose ?-He is intolerant to pioglitazone ?-He is not a candidate for GLP-1 agonist nor DPP 4 inhibitors nor Mounjaro due to history of pancreatitis to Januvia ?-We discussed the importance of taking medications before meals ?-We discussed the importance of avoiding CHO with snacks and emphasized the importance of having CHO with meals that way the repaglinide will cover CHO intake ?-We discussed that in the future I will discontinue his WelChol ? ? ?MEDICATIONS: ?Stop acarbose ?Stop bromocriptine ?Continue metformin 500 mg XR 4 tabs daily ?Continue repaglinide 2 mg twice daily ?Continue Welchol 325 mg, 3 tabs  twice daily ? ?EDUCATION / INSTRUCTIONS: ?BG monitoring instructions: Patient is instructed to check his blood sugars 1 times a day. ?Call [facility] if: BG persistently < 70  ?I reviewed th

## 2021-06-22 NOTE — Patient Instructions (Signed)
-   Stop Acarbose  ?- Stop Bromocriptine  ?- Continue Metformin 500 mg XR 4 tablets daily  ?- Continue Repaglinide 2 mg , 1 tablet twice daily before meals  ?- Continue Welchol 625 mg, 3 tablets twice daily  ? ? ? ? ?HOW TO TREAT LOW BLOOD SUGARS (Blood sugar LESS THAN 70 MG/DL) ?Please follow the RULE OF 15 for the treatment of hypoglycemia treatment (when your (blood sugars are less than 70 mg/dL)  ? ?STEP 1: Take 15 grams of carbohydrates when your blood sugar is low, which includes:  ?3-4 GLUCOSE TABS  OR ?3-4 OZ OF JUICE OR REGULAR SODA OR ?ONE TUBE OF GLUCOSE GEL   ? ?STEP 2: RECHECK blood sugar in 15 MINUTES ?STEP 3: If your blood sugar is still low at the 15 minute recheck --> then, go back to STEP 1 and treat AGAIN with another 15 grams of carbohydrates. ? ?

## 2021-06-23 LAB — MICROALBUMIN / CREATININE URINE RATIO
Creatinine,U: 27.2 mg/dL
Microalb Creat Ratio: 8.5 mg/g (ref 0.0–30.0)
Microalb, Ur: 2.3 mg/dL — ABNORMAL HIGH (ref 0.0–1.9)

## 2021-06-23 LAB — COMPREHENSIVE METABOLIC PANEL
ALT: 16 U/L (ref 0–53)
AST: 14 U/L (ref 0–37)
Albumin: 4.2 g/dL (ref 3.5–5.2)
Alkaline Phosphatase: 53 U/L (ref 39–117)
BUN: 24 mg/dL — ABNORMAL HIGH (ref 6–23)
CO2: 26 mEq/L (ref 19–32)
Calcium: 9.8 mg/dL (ref 8.4–10.5)
Chloride: 101 mEq/L (ref 96–112)
Creatinine, Ser: 1.03 mg/dL (ref 0.40–1.50)
GFR: 72.67 mL/min (ref >60.00)
Glucose, Bld: 157 mg/dL — ABNORMAL HIGH (ref 70–99)
Potassium: 4.3 mEq/L (ref 3.5–5.1)
Sodium: 136 mEq/L (ref 135–145)
Total Bilirubin: 0.4 mg/dL (ref 0.2–1.2)
Total Protein: 6.5 g/dL (ref 6.0–8.3)

## 2021-06-23 LAB — LIPID PANEL
Cholesterol: 110 mg/dL (ref 0–200)
HDL: 41.4 mg/dL (ref >39.00)
LDL Cholesterol: 37 mg/dL (ref 0–99)
NonHDL: 68.62
Total CHOL/HDL Ratio: 3
Triglycerides: 160 mg/dL — ABNORMAL HIGH (ref 0.0–149.0)
VLDL: 32 mg/dL (ref 0.0–40.0)

## 2021-10-10 ENCOUNTER — Emergency Department (HOSPITAL_COMMUNITY): Payer: Medicare Other

## 2021-10-10 ENCOUNTER — Encounter (HOSPITAL_COMMUNITY): Payer: Self-pay | Admitting: Emergency Medicine

## 2021-10-10 ENCOUNTER — Other Ambulatory Visit: Payer: Self-pay

## 2021-10-10 ENCOUNTER — Emergency Department (HOSPITAL_COMMUNITY)
Admission: EM | Admit: 2021-10-10 | Discharge: 2021-10-10 | Disposition: A | Discharge status: Home / Self Care | Payer: Medicare Other | Attending: Emergency Medicine | Admitting: Emergency Medicine

## 2021-10-10 DIAGNOSIS — K869 Disease of pancreas, unspecified: Secondary | ICD-10-CM | POA: Diagnosis not present

## 2021-10-10 DIAGNOSIS — I10 Essential (primary) hypertension: Secondary | ICD-10-CM | POA: Diagnosis not present

## 2021-10-10 DIAGNOSIS — K5900 Constipation, unspecified: Secondary | ICD-10-CM | POA: Diagnosis not present

## 2021-10-10 DIAGNOSIS — D72829 Elevated white blood cell count, unspecified: Secondary | ICD-10-CM | POA: Insufficient documentation

## 2021-10-10 DIAGNOSIS — E1165 Type 2 diabetes mellitus with hyperglycemia: Secondary | ICD-10-CM | POA: Diagnosis not present

## 2021-10-10 DIAGNOSIS — Z7984 Long term (current) use of oral hypoglycemic drugs: Secondary | ICD-10-CM | POA: Diagnosis not present

## 2021-10-10 DIAGNOSIS — Z79899 Other long term (current) drug therapy: Secondary | ICD-10-CM | POA: Insufficient documentation

## 2021-10-10 DIAGNOSIS — R109 Unspecified abdominal pain: Secondary | ICD-10-CM

## 2021-10-10 DIAGNOSIS — R1110 Vomiting, unspecified: Secondary | ICD-10-CM

## 2021-10-10 LAB — COMPREHENSIVE METABOLIC PANEL
ALT: 19 U/L (ref 0–44)
AST: 16 U/L (ref 15–41)
Albumin: 4.3 g/dL (ref 3.5–5.0)
Alkaline Phosphatase: 58 U/L (ref 38–126)
Anion gap: 13 (ref 5–15)
BUN: 28 mg/dL — ABNORMAL HIGH (ref 8–23)
CO2: 23 mmol/L (ref 22–32)
Calcium: 10.3 mg/dL (ref 8.9–10.3)
Chloride: 103 mmol/L (ref 98–111)
Creatinine, Ser: 1.09 mg/dL (ref 0.61–1.24)
GFR, Estimated: >60 mL/min (ref >60)
Glucose, Bld: 298 mg/dL — ABNORMAL HIGH (ref 70–99)
Potassium: 3.5 mmol/L (ref 3.5–5.1)
Sodium: 139 mmol/L (ref 135–145)
Total Bilirubin: 1.2 mg/dL (ref 0.3–1.2)
Total Protein: 7.1 g/dL (ref 6.5–8.1)

## 2021-10-10 LAB — URINALYSIS, ROUTINE W REFLEX MICROSCOPIC
Bacteria, UA: NONE SEEN
Bilirubin Urine: NEGATIVE
Glucose, UA: >=500 mg/dL — AB
Hgb urine dipstick: NEGATIVE
Ketones, ur: 20 mg/dL — AB
Leukocytes,Ua: NEGATIVE
Nitrite: NEGATIVE
Protein, ur: NEGATIVE mg/dL
Specific Gravity, Urine: 1.035 — ABNORMAL HIGH (ref 1.005–1.030)
pH: 5 (ref 5.0–8.0)

## 2021-10-10 LAB — CBC WITH DIFFERENTIAL/PLATELET
Abs Immature Granulocytes: 0.05 10*3/uL (ref 0.00–0.07)
Basophils Absolute: 0.1 10*3/uL (ref 0.0–0.1)
Basophils Relative: 1 %
Eosinophils Absolute: 0 10*3/uL (ref 0.0–0.5)
Eosinophils Relative: 0 %
HCT: 44.1 % (ref 39.0–52.0)
Hemoglobin: 15.1 g/dL (ref 13.0–17.0)
Immature Granulocytes: 1 %
Lymphocytes Relative: 4 %
Lymphs Abs: 0.4 10*3/uL — ABNORMAL LOW (ref 0.7–4.0)
MCH: 31.5 pg (ref 26.0–34.0)
MCHC: 34.2 g/dL (ref 30.0–36.0)
MCV: 92.1 fL (ref 80.0–100.0)
Monocytes Absolute: 0.4 10*3/uL (ref 0.1–1.0)
Monocytes Relative: 4 %
Neutro Abs: 9.6 10*3/uL — ABNORMAL HIGH (ref 1.7–7.7)
Neutrophils Relative %: 90 %
Platelets: 227 10*3/uL (ref 150–400)
RBC: 4.79 MIL/uL (ref 4.22–5.81)
RDW: 12.6 % (ref 11.5–15.5)
WBC: 10.6 10*3/uL — ABNORMAL HIGH (ref 4.0–10.5)
nRBC: 0 % (ref 0.0–0.2)

## 2021-10-10 LAB — LIPASE, BLOOD: Lipase: 61 U/L — ABNORMAL HIGH (ref 11–51)

## 2021-10-10 LAB — I-STAT CHEM 8, ED
BUN: 29 mg/dL — ABNORMAL HIGH (ref 8–23)
Calcium, Ion: 1.16 mmol/L (ref 1.15–1.40)
Chloride: 103 mmol/L (ref 98–111)
Creatinine, Ser: 0.8 mg/dL (ref 0.61–1.24)
Glucose, Bld: 296 mg/dL — ABNORMAL HIGH (ref 70–99)
HCT: 45 % (ref 39.0–52.0)
Hemoglobin: 15.3 g/dL (ref 13.0–17.0)
Potassium: 3.5 mmol/L (ref 3.5–5.1)
Sodium: 138 mmol/L (ref 135–145)
TCO2: 22 mmol/L (ref 22–32)

## 2021-10-10 MED ORDER — IOHEXOL 9 MG/ML PO SOLN: 500.0000 mL | ORAL | Status: AC

## 2021-10-10 MED ORDER — SODIUM CHLORIDE 0.9 % IV SOLN: INTRAVENOUS | Status: DC

## 2021-10-10 MED ORDER — POLYETHYLENE GLYCOL 3350 17 G PO PACK: 17.0000 g | PACK | Freq: Two times a day (BID) | ORAL | 0 refills | Status: AC

## 2021-10-10 MED ORDER — SODIUM CHLORIDE 0.9 % IV BOLUS
1000.0000 mL | Freq: Once | INTRAVENOUS | Status: AC
  Administered 2021-10-10: 1000 mL via INTRAVENOUS

## 2021-10-10 MED ORDER — IOHEXOL 300 MG/ML  SOLN
100.0000 mL | Freq: Once | INTRAMUSCULAR | Status: AC | PRN
Start: 2021-10-10 — End: 2021-10-10
  Administered 2021-10-10: 100 mL via INTRAVENOUS

## 2021-10-10 MED ORDER — ONDANSETRON HCL 4 MG/2ML IJ SOLN
4.0000 mg | Freq: Once | INTRAMUSCULAR | Status: AC
  Administered 2021-10-10: 4 mg via INTRAVENOUS
  Filled 2021-10-10: qty 2

## 2021-10-10 MED ORDER — ONDANSETRON 8 MG PO TBDP: 8.0000 mg | ORAL_TABLET | Freq: Three times a day (TID) | ORAL | 0 refills | Status: AC | PRN

## 2021-10-10 MED ORDER — MORPHINE SULFATE (PF) 4 MG/ML IV SOLN
4.0000 mg | Freq: Once | INTRAVENOUS | Status: AC
  Administered 2021-10-10: 4 mg via INTRAVENOUS
  Filled 2021-10-10: qty 1

## 2021-10-10 NOTE — ED Notes (Deleted)
Pt transported to CT ?

## 2021-10-10 NOTE — Discharge Instructions (Addendum)
Take the medications as prescribed for your nausea and constipation.  The CT scan also showed some incidental nodules in the pancreas.  This apparently was seen on the previous CT scan in the past.  The radiologist did recommend following up with an outpatient MRI to make sure these are benign lesions.Follow-up with your doctor next week to be rechecked and to follow up on the MRI.  Return to the ER for worsening or recurrent symptoms.

## 2021-10-10 NOTE — ED Triage Notes (Signed)
Pt endorses mid abd pain across his entire abd and emesis since last night. Pt had portion of colon removed in Feb.

## 2021-10-10 NOTE — ED Notes (Signed)
Pt transported to CT ?

## 2021-10-10 NOTE — ED Provider Notes (Signed)
[facility] EMERGENCY DEPARTMENT Provider Note   CSN: [id number] Arrival date & time: 10/10/21  3710     History  Chief Complaint  Patient presents with   Abdominal Pain   Emesis    Adam Cardenas is a 71 y.o. male.   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  Patient presents to the ED with complaints of abdominal pain.  Patient started having symptoms last evening.  He has a band of pain and discomfort around his mid abdomen.  It is coming and going in waves.  It is very severe at times.  He has had a couple episodes of nausea and vomiting.  He has felt constipated and tried to use enemas.  Has not passed any gas.  No fevers.  Home Medications Prior to Admission medications   Medication Sig Start Date End Date Taking? Authorizing Provider  ondansetron (ZOFRAN-ODT) 8 MG disintegrating tablet Take 1 tablet (8 mg total) by mouth every 8 (eight) hours as needed for nausea or vomiting. 10/10/21  Yes Linwood Dibbles, MD  polyethylene glycol (MIRALAX) 17 g packet Take 17 g by mouth 2 (two) times daily. 10/10/21  Yes Linwood Dibbles, MD  colesevelam [facility]) 625 MG tablet Take 3 tablets (1,875 mg total) by mouth 2 (two) times daily with a meal. 06/22/21   Shamleffer, Konrad Dolores, MD  empagliflozin (JARDIANCE) 25 MG TABS tablet Take 1 tablet (25 mg total) by mouth daily. 06/22/21   Shamleffer, Konrad Dolores, MD  felodipine (PLENDIL) 5 MG 24 hr tablet Take 5 mg by mouth daily.    [provider]  finasteride (PROSCAR) 5 MG tablet Take 5 mg by mouth daily. 10/01/17   [provider]  lisinopril-hydrochlorothiazide (PRINZIDE,ZESTORETIC) 20-25 MG tablet Take 2 tablets by mouth daily.     [provider]  metFORMIN (GLUCOPHAGE-XR) 500 MG 24 hr  tablet Take 4 tablets (2,000 mg total) by mouth daily. 06/22/21   Shamleffer, Konrad Dolores, MD  metoprolol succinate (TOPROL-XL) 25 MG 24 hr tablet Take 25 mg by mouth daily. 03/31/20   [provider]  omeprazole (PRILOSEC) 20 MG capsule Take 20 mg by mouth daily. 10/02/17   [provider]  repaglinide (PRANDIN) 2 MG tablet Take 1 tablet (2 mg total) by mouth 2 (two) times daily before a meal. 06/22/21   Shamleffer, Konrad Dolores, MD  simvastatin (ZOCOR) 40 MG tablet Take 40 mg by mouth at bedtime.    [provider]  tadalafil (CIALIS) 20 MG tablet 1 tablet    [provider]      Allergies    Insulins and Sitagliptin    Review of Systems   Review of Systems  Gastrointestinal:  Positive for abdominal pain and vomiting.    Physical Exam Updated Vital Signs BP (!) 143/72   Pulse 68   Temp 98.8 F (37.1 C)   Resp 19   Ht 1.854 m (6\' 1" )   Wt 86.2 kg   SpO2 100%   BMI 25.07 kg/m  Physical Exam Vitals and nursing note reviewed.  Constitutional:      Appearance: He is well-developed. He is not diaphoretic.  HENT:     Head: Normocephalic and atraumatic.     Right Ear: External ear normal.     Left Ear: External ear normal.  Eyes:     General: No scleral icterus.       Right eye: No discharge.        Left eye: No discharge.     Conjunctiva/sclera: Conjunctivae normal.  Neck:     Trachea: No tracheal deviation.  Cardiovascular:     Rate and Rhythm: Normal rate and regular rhythm.  Pulmonary:     Effort: Pulmonary effort is normal. No respiratory distress.     Breath sounds: Normal breath sounds. No stridor. No wheezing or rales.  Abdominal:     General: Bowel sounds are decreased. There is no distension.     Palpations: Abdomen is soft.     Tenderness: There is abdominal tenderness in the periumbilical area. There is no guarding or rebound.     Hernia: No hernia is present.  Musculoskeletal:        General: No tenderness or  deformity.     Cervical back: Neck supple.  Skin:    General: Skin is warm and dry.     Findings: No rash.  Neurological:     General: No focal deficit present.     Mental Status: He is alert.     Cranial Nerves: No cranial nerve deficit (no facial droop, extraocular movements intact, no slurred speech).     Sensory: No sensory deficit.     Motor: No abnormal muscle tone or seizure activity.     Coordination: Coordination normal.  Psychiatric:        Mood and Affect: Mood normal.     ED Results / Procedures / Treatments   Labs (all labs ordered are listed, but only abnormal results are displayed) Labs Reviewed  COMPREHENSIVE METABOLIC PANEL - Abnormal; Notable for the following components:      Result Value   Glucose, Bld 298 (*)    BUN 28 (*)    All other components within normal limits  LIPASE, BLOOD - Abnormal; Notable for the following components:   Lipase 61 (*)    All other components within normal limits  CBC WITH DIFFERENTIAL/PLATELET - Abnormal; Notable for the following components:   WBC 10.6 (*)    Neutro Abs 9.6 (*)    Lymphs Abs 0.4 (*)    All other components within normal limits  URINALYSIS, ROUTINE W REFLEX MICROSCOPIC - Abnormal; Notable for the following components:   Color, Urine STRAW (*)    Specific Gravity, Urine 1.035 (*)    Glucose, UA >=500 (*)    Ketones, ur 20 (*)    All other components within normal limits  I-STAT CHEM 8, ED - Abnormal; Notable for the following components:   BUN 29 (*)    Glucose, Bld 296 (*)    All other components within normal limits    EKG EKG Interpretation  Date/Time:  Saturday October 10 2021 07:40:02 EDT Ventricular Rate:  78 PR Interval:  137 QRS Duration: 103 QT Interval:  393 QTC Calculation: 448 R Axis:   50 Text Interpretation: Sinus rhythm Inferior infarct, old No significant change since last tracing Confirmed by Linwood Dibbles [phone]) on 10/10/2021 7:47:15 AM  Radiology CT ABDOMEN PELVIS W  CONTRAST  Result Date: 10/10/2021 CLINICAL DATA:  Abdominal pain and vomiting. Recent partial colectomy. EXAM: CT ABDOMEN AND PELVIS WITH CONTRAST TECHNIQUE: Multidetector CT imaging of the abdomen and pelvis was performed using the standard protocol following bolus administration of intravenous contrast. RADIATION DOSE REDUCTION: This exam was performed according to the departmental dose-optimization program which includes automated exposure  control, adjustment of the mA and/or kV according to patient size and/or use of iterative reconstruction technique. CONTRAST:  OMNIPAQUE IOHEXOL 300 MG/ML  SOLN COMPARISON:  07/31/2013 FINDINGS: Lower chest: Dependent atelectasis noted both lower lobes and lingula. Hepatobiliary: No suspicious focal abnormality within the liver parenchyma. Small area of low attenuation in the anterior liver, adjacent to the falciform ligament, is in a characteristic location for focal fatty deposition. There is no evidence for gallstones, gallbladder wall thickening, or pericholecystic fluid. No intrahepatic or extrahepatic biliary dilation. Pancreas: 16 mm subtle hypoattenuating lesion noted in the head of the pancreas on 33/3. This measured approximately 12 mm previously. A second 10 mm hypoattenuating lesion near the junction of the body and tail (22/3) was 8 mm previously. No main duct dilatation. Spleen: No splenomegaly. No focal mass lesion. Adrenals/Urinary Tract: No adrenal nodule or mass. Kidneys unremarkable. No evidence for hydroureter. The urinary bladder appears normal for the degree of distention. Stomach/Bowel: Stomach is unremarkable. No gastric wall thickening. No evidence of outlet obstruction. Duodenum is normally positioned as is the ligament of Treitz. No small bowel wall thickening. Small bowel loops appeared mildly distended throughout although pattern is not frankly obstructive in oral contrast material has migrated through to the ileocecal valve. Scattered areas  of apparent mild mesenteric congestion evident (midline on 51/3 and left abdomen on 57/3). The terminal ileum is normal. The appendix is normal. Colonic anastomosis noted in the region of the proximal transverse segment. No colonic wall thickening or pericolonic edema/inflammation. Large stool volume noted right and transverse colon. Vascular/Lymphatic: There is moderate atherosclerotic calcification of the abdominal aorta without aneurysm. There is no gastrohepatic or hepatoduodenal ligament lymphadenopathy. No retroperitoneal or mesenteric lymphadenopathy. No pelvic sidewall lymphadenopathy. Reproductive: Prostate gland upper normal to mildly enlarged. Other: Trace free fluid is seen in the anterior mesentery of the right abdomen (see image 51 of series 3) and in the right paracolic gutter (66/4). Trace free fluid also noted in the pelvis (75/3). Musculoskeletal: No worrisome lytic or sclerotic osseous abnormality. IMPRESSION: 1. Trace free fluid in the anterior mesentery of the right abdomen and right paracolic gutter with trace free fluid also noted in the pelvis. 2. Small bowel loops appeared mildly distended throughout although pattern is not frankly obstructive and oral contrast material has migrated through to the ileocecal valve. Scattered areas of mild mesenteric congestion evident. Infectious/inflammatory ileus would be a consideration. 3. Large stool volume right and transverse colon. Underlying component of clinical constipation would be a consideration. 4. 16 mm subtle hypoattenuating lesion in the head of the pancreas with second 10 mm hypoattenuating lesion near the junction of the body and tail. Lesions are only minimally progressive in the long interval since prior study suggesting benign etiology. Follow-up nonemergent outpatient MRI of the abdomen without and with contrast may prove helpful to further evaluate. 5. Aortic Atherosclerosis (ICD10-I70.0). Electronically Signed   By: Kennith Center  M.D.   On: 10/10/2021 12:25   DG Chest Portable 1 View  Result Date: 10/10/2021 CLINICAL DATA:  71 year old male with chest pain and shortness of breath. Abdominal pain. EXAM: PORTABLE CHEST 1 VIEW COMPARISON:  Chest radiographs 05/29/2012. FINDINGS: Portable AP semi upright view at 0749 hours. Lung volumes are stable since 2014 and within normal limits. Mild eventration of the left hemidiaphragm suspected (normal variant). Normal cardiac size and mediastinal contours. Visualized tracheal air column is within normal limits. Allowing for portable technique the lungs are clear. No pneumothorax. No acute osseous abnormality identified. Paucity  of bowel gas in the visible upper abdomen. IMPRESSION: No acute cardiopulmonary abnormality. Electronically Signed   By: Odessa FlemingH  Hall M.D.   On: 10/10/2021 07:58    Procedures Procedures    Medications Ordered in ED Medications  sodium chloride 0.9 % bolus 1,000 mL (0 mLs Intravenous Stopped 10/10/21 0915)    And  0.9 %  sodium chloride infusion ( Intravenous New Bag/Given 10/10/21 0919)  iohexol (OMNIPAQUE) 9 MG/ML oral solution 500 mL (has no administration in time range)  morphine (PF) 4 MG/ML injection 4 mg (4 mg Intravenous Given 10/10/21 0754)  ondansetron (ZOFRAN) injection 4 mg (4 mg Intravenous Given 10/10/21 0753)  iohexol (OMNIPAQUE) 300 MG/ML solution 100 mL (100 mLs Intravenous Contrast Given 10/10/21 1200)    ED Course/ Medical Decision Making/ A&P Clinical Course as of 10/10/21 1451  Sat Oct 10, 2021  0811 CXR without acute findings [JK]  1325 Urinalysis, Routine w reflex microscopic Urine, Clean Catch(!) without signs of infection [JK]  1325 I-Stat Chem 8, ED(!) Hyperglycemia noted [JK]  1325 CBC with Diff(!) Slight increase in Barba count [JK]  1327 CT scan does show some scattered loops of mild small bowel dilatation.  No frank obstruction, large stool burden noted.  Incidental pancreatic findings noted [JK]  1327  obstruction.  Large stool  burden noted [JK]  1333 DG Chest Portable 1 View [JK]  1342 Repeat abdominal exam.  Patient is nontender.  Discussed findings with him.  Would like to try a p.o. challenge [JK]    Clinical Course User Index [JK] Linwood DibblesKnapp, Laelle Bridgett, MD                           Medical Decision Making Problems Addressed: Abdominal pain, unspecified abdominal location: acute illness or injury that poses a threat to life or bodily functions Constipation, unspecified constipation type: chronic illness or injury with exacerbation, progression, or side effects of treatment Pancreatic lesion: chronic illness or injury Vomiting, unspecified vomiting type, unspecified whether nausea present: acute illness or injury  Amount and/or Complexity of Data Reviewed Labs: ordered. Decision-making details documented in ED Course. Radiology: ordered and independent interpretation performed. Decision-making details documented in ED Course.  Risk Prescription drug management.   Patient presents to the ED for evaluation of abdominal pain.  Laboratory test did not suggest hepatitis.  Or pancreatitis.  No signs of severe dehydration.  Hyperglycemia noted but no signs of acidosis.  CT scan shows some mild small bowel dilatation but no findings to suggest intestinal obstruction.  Patient also noted to have constipation on CT scan which he does have a chronic history of.  Patient is feeling much better.  Repeat abdominal exam is benign.  Discussed options of admission to the hospital for observation versus outpatient management and strict return precautions.  Patient feels comfortable with discharge.  Patient was able to drink fluids and eat crackers without vomiting.  Incidental pancreatic findings discussed        Final Clinical Impression(s) / ED Diagnoses Final diagnoses:  Constipation, unspecified constipation type  Vomiting, unspecified vomiting type, unspecified whether nausea present  Abdominal pain, unspecified abdominal  location  Pancreatic lesion    Rx / DC Orders ED Discharge Orders          Ordered    polyethylene glycol (MIRALAX) 17 g packet  2 times daily        10/10/21 1343    ondansetron (ZOFRAN-ODT) 8 MG disintegrating tablet  Every 8  hours PRN        10/10/21 1343              Linwood Dibbles, MD 10/10/21 1451

## 2021-10-13 IMAGING — US US EXTREM LOW VENOUS
1 series · 13 of 24 positions shown · non-contrast
Comparison: None.

CLINICAL DATA: Bilateral lower extremity edema.  Evaluate for DVT.



[Series 1: us extrem low venous · 0.11mm/px · 13 of 62 slices shown]
[im 1/62]
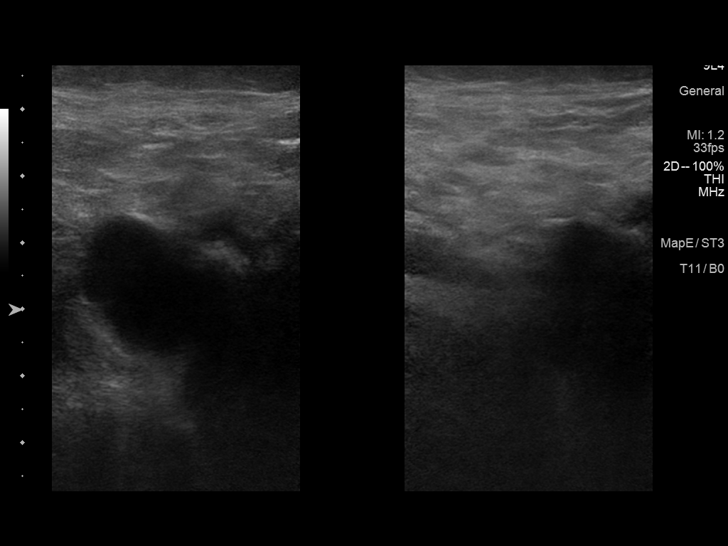
[im 6/62]
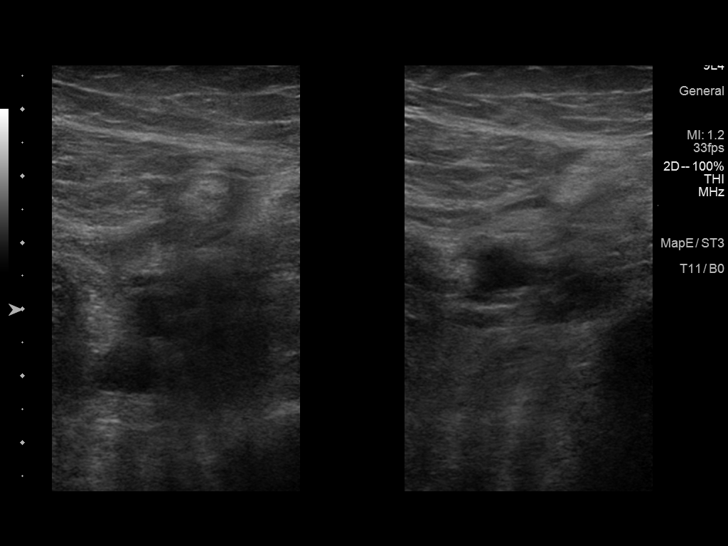
[im 11/62]
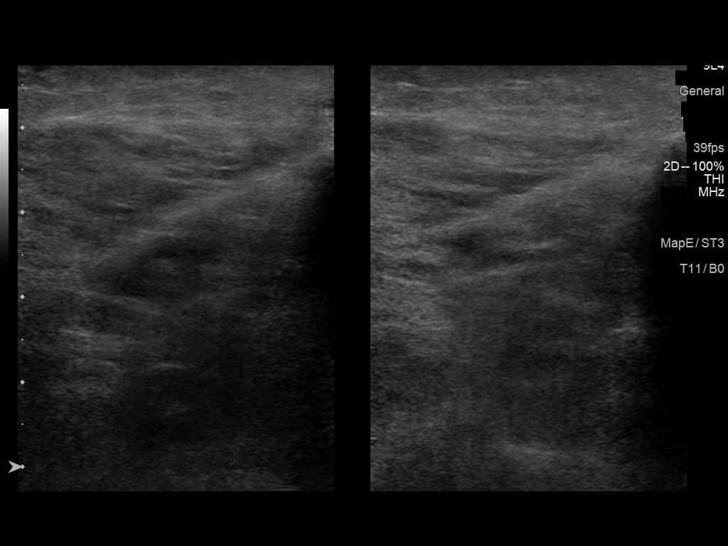
[im 16/62]
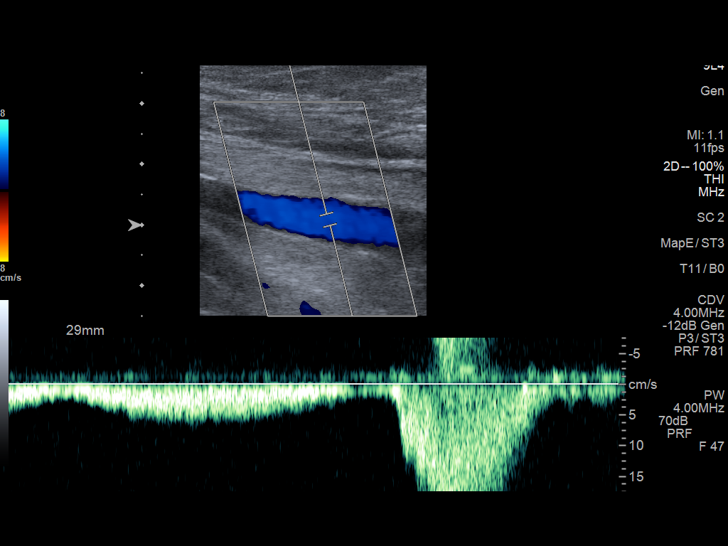
[im 22/62]
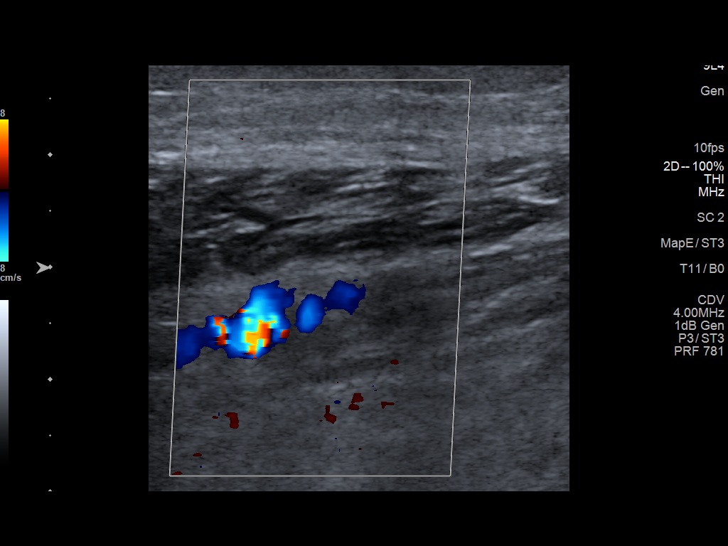
[im 27/62]
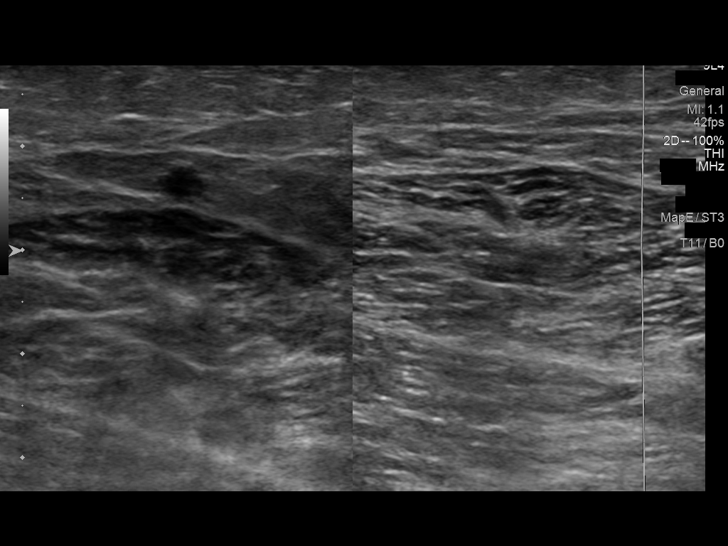
[im 32/62]
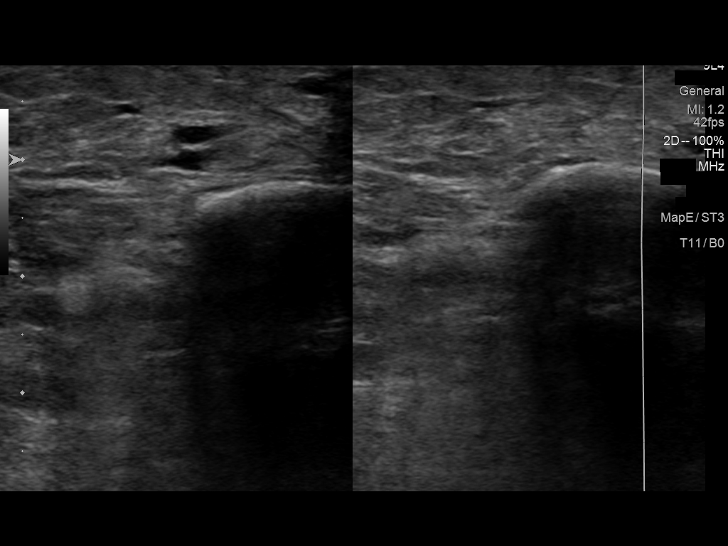
[im 35/62]
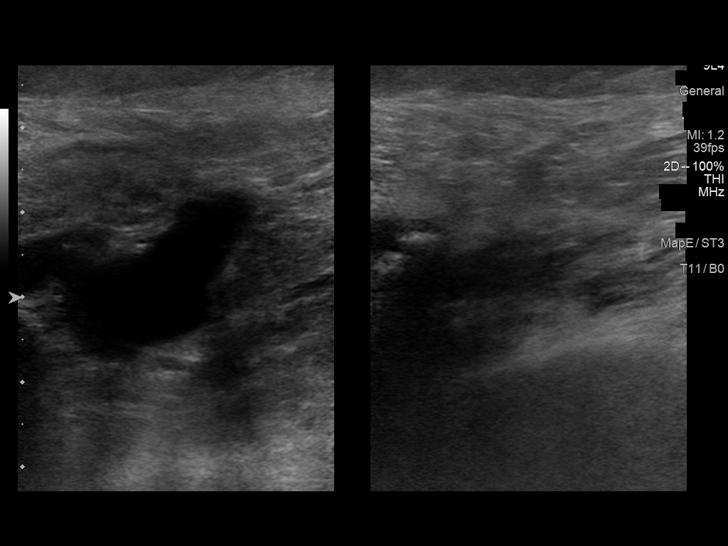
[im 40/62]
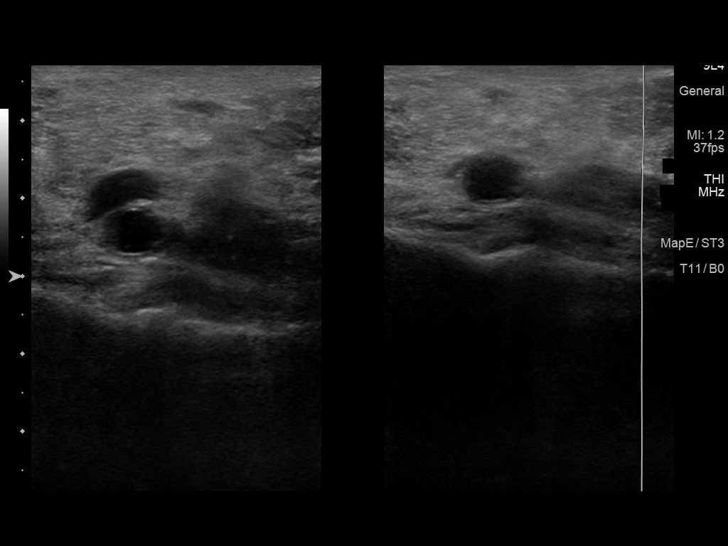
[im 46/62]
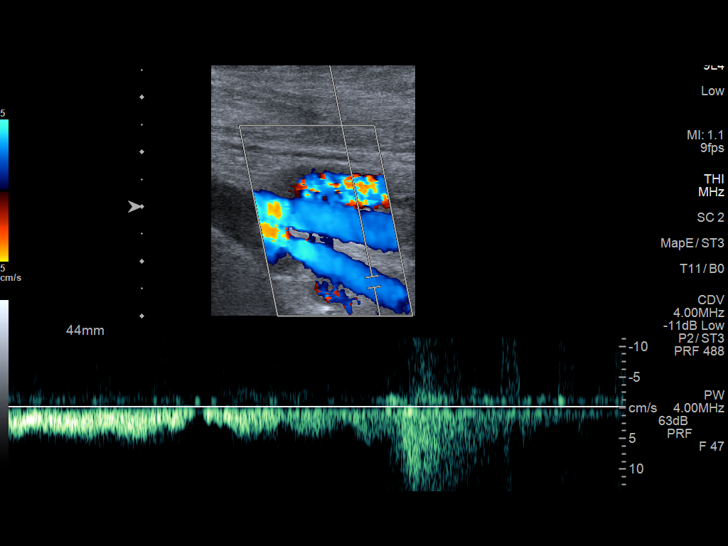
[im 51/62]
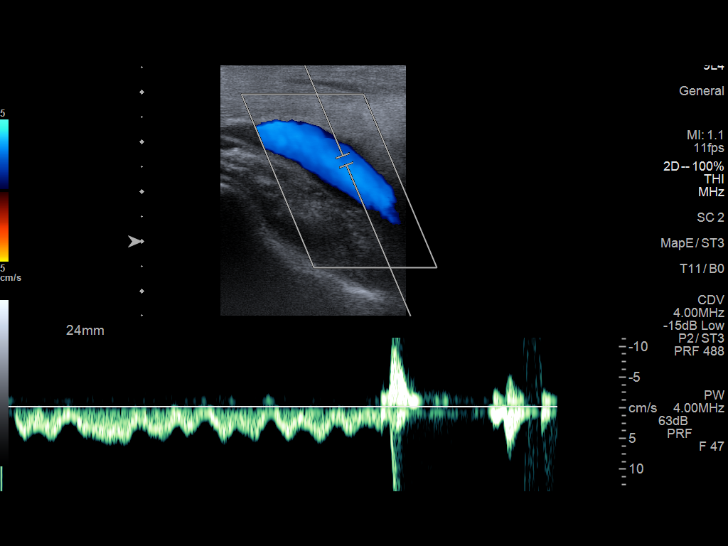
[im 56/62]
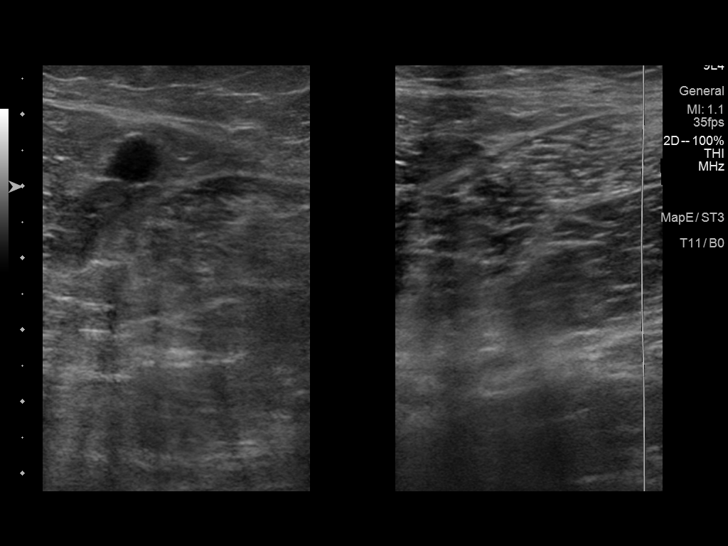
[im 62/62]
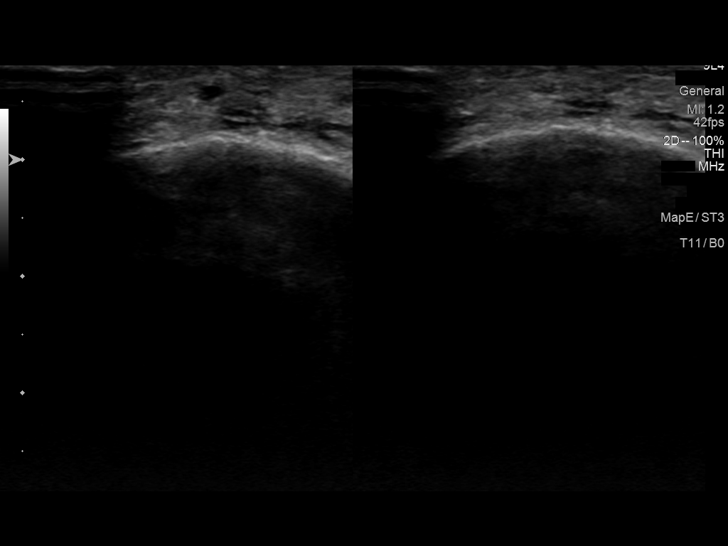

[13 of 24 positions shown; findings below may reference images not displayed]

FINDINGS: RIGHT LOWER EXTREMITY

Common Femoral Vein: No evidence of thrombus. Normal
compressibility, respiratory phasicity and response to augmentation.

Saphenofemoral Junction: No evidence of thrombus. Normal
compressibility and flow on color Doppler imaging.

Profunda Femoral Vein: No evidence of thrombus. Normal
compressibility and flow on color Doppler imaging.

Femoral Vein: No evidence of thrombus. Normal compressibility,
respiratory phasicity and response to augmentation.

Popliteal Vein: No evidence of thrombus. Normal compressibility,
respiratory phasicity and response to augmentation.

Calf Veins: No evidence of thrombus. Normal compressibility and flow
on color Doppler imaging.

Superficial Great Saphenous Vein: No evidence of thrombus. Normal
compressibility.

Venous Reflux:  None.

Other Findings:  None.

LEFT LOWER EXTREMITY

Common Femoral Vein: No evidence of thrombus. Normal
compressibility, respiratory phasicity and response to augmentation.

Saphenofemoral Junction: No evidence of thrombus. Normal
compressibility and flow on color Doppler imaging.

Profunda Femoral Vein: No evidence of thrombus. Normal
compressibility and flow on color Doppler imaging.

Femoral Vein: No evidence of thrombus. Normal compressibility,
respiratory phasicity and response to augmentation.

Popliteal Vein: No evidence of thrombus. Normal compressibility,
respiratory phasicity and response to augmentation.

Calf Veins: No evidence of thrombus. Normal compressibility and flow
on color Doppler imaging.

Superficial Great Saphenous Vein: No evidence of thrombus. Normal
compressibility.

Venous Reflux:  None.

Other Findings:  None.
IMPRESSION: No evidence of DVT within either lower extremity.

## 2021-12-28 ENCOUNTER — Encounter: Payer: Self-pay | Admitting: Internal Medicine

## 2021-12-28 ENCOUNTER — Ambulatory Visit (INDEPENDENT_AMBULATORY_CARE_PROVIDER_SITE_OTHER): Payer: Medicare Other | Admitting: Internal Medicine

## 2021-12-28 VITALS — BP 136/80 | HR 70 | Ht 73.0 in | Wt 207.0 lb

## 2021-12-28 DIAGNOSIS — E1129 Type 2 diabetes mellitus with other diabetic kidney complication: Secondary | ICD-10-CM | POA: Diagnosis not present

## 2021-12-28 DIAGNOSIS — R809 Proteinuria, unspecified: Secondary | ICD-10-CM | POA: Diagnosis not present

## 2021-12-28 DIAGNOSIS — E1142 Type 2 diabetes mellitus with diabetic polyneuropathy: Secondary | ICD-10-CM

## 2021-12-28 DIAGNOSIS — E785 Hyperlipidemia, unspecified: Secondary | ICD-10-CM

## 2021-12-28 LAB — POCT GLUCOSE (DEVICE FOR HOME USE): Glucose Fasting, POC: 165 mg/dL — AB (ref 70–99)

## 2021-12-28 LAB — POCT GLYCOSYLATED HEMOGLOBIN (HGB A1C): Hemoglobin A1C: 6.8 % — AB (ref 4.0–5.6)

## 2021-12-28 MED ORDER — REPAGLINIDE 2 MG PO TABS
ORAL_TABLET | ORAL | 3 refills | Status: DC
Start: 2021-12-28 — End: 2022-06-28

## 2021-12-28 MED ORDER — METFORMIN HCL ER 500 MG PO TB24: 2000.0000 mg | ORAL_TABLET | Freq: Every day | ORAL | 3 refills | Status: DC

## 2021-12-28 MED ORDER — EMPAGLIFLOZIN 25 MG PO TABS: 25.0000 mg | ORAL_TABLET | Freq: Every day | ORAL | 3 refills | Status: DC

## 2021-12-28 MED ORDER — COLESEVELAM HCL 625 MG PO TABS: 1250.0000 mg | ORAL_TABLET | Freq: Two times a day (BID) | ORAL | 3 refills | Status: DC

## 2021-12-28 NOTE — Progress Notes (Signed)
Name: Adam Cardenas  Age/ Sex: 71 y.o., male   MRN/ DOB: [id number], October 27, 1950     PCP: Farris Has, MD   Reason for Endocrinology Evaluation: Type 2 Diabetes Mellitus  Initial Endocrine Consultative Visit: 02/21/2017    PATIENT IDENTIFIER: Mr. Adam Cardenas is a 71 y.o. male with a past medical history of T2DM,Hx of pancreatitis (questionable relation to [place]). The patient has followed with Endocrinology clinic since 02/21/2017 for consultative assistance with management of his diabetes.  DIABETIC HISTORY:  Mr. Fritchman was diagnosed with DM 2005, he has history of pancreatitis that may have been attributed to Januvia, pioglitazone caused LE edema . His hemoglobin A1c has ranged from 6.4% in 2021, peaking at 9.6% in 2020.   SUBJECTIVE:   During the last visit (06/22/2021): A1c 7.6%   Today (12/28/2021): [name] is here for follow-up on diabetes management.  He checks his blood sugars occasionally.  The patient has not had hypoglycemic episodes since the last clinic visit.  Presented to the ED for vomiting on 10/10/2021 He was seen by urology on 10/26/2021 for BPH  Lost his job , has cut out sweets and sugar-sweetened beverages   He has sensation loss in the feet     HOME DIABETES REGIMEN:  WelChol 625, 3 tabs twice daily Jardiance 25 mg daily Repaglinide 2 mg BID   Metformin 500 mg XR 4 tabs in the evenings     Statin: Yes ACE-I/ARB: Yes    METER DOWNLOAD SUMMARY: unable to download  166- 345 mg/dL   DIABETIC COMPLICATIONS: Microvascular complications:   Denies: CKD, neuropathy , retinopathy  Last Eye Exam: Completed 2021  Macrovascular complications:   Denies: CAD, CVA, PVD   HISTORY:  Past Medical History:  Past Medical History:  Diagnosis Date   Cellulitis    Diabetes mellitus    HTN (hypertension)    Hypercholesterolemia    Seasonal allergic rhinitis    Sleep apnea    Past Surgical History:  Past Surgical History:  Procedure Laterality Date    CATARACT EXTRACTION Bilateral    I & D EXTREMITY Left 11/06/2017   Procedure: IRRIGATION AND DEBRIDEMENT LEG;  Surgeon: Sheral Apley, MD;  Location: [facility] OR;  Service: Orthopedics;  Laterality: Left;   repair deviated septum     Social History:  reports that he quit smoking about 23 years ago. His smoking use included cigarettes. He has a 30.00 pack-year smoking history. He has never used smokeless tobacco. He reports current alcohol use. He reports that he does not use drugs. 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 If you have any questions, ask your nurse or doctor.          B-12 5000 MCG Caps Take 1 tablet by mouth daily at 6 (six) AM.   colesevelam 625 MG tablet Commonly known as: WELCHOL Take 3 tablets by mouth 2 (two) times daily with a meal. What changed: Another medication with the same name was removed. Continue taking  this medication, and follow the directions you see here. Changed by: Scarlette Shorts, MD   felodipine 5 MG 24 hr tablet Commonly known as: PLENDIL Take 5 mg by mouth daily. What changed: Another medication with the same name was removed. Continue taking this medication, and follow the directions you see here. Changed by: Scarlette Shorts, MD   ferrous sulfate 325 (65 FE) MG EC tablet Take 325 mg by mouth 3 (three) times daily with meals.   finasteride 5 MG tablet Commonly known as: PROSCAR Take 5 mg by mouth daily.   Jardiance 25 MG Tabs tablet Generic drug: empagliflozin Take 25 mg by mouth daily. What changed: Another medication with the same name was  removed. Continue taking this medication, and follow the directions you see here. Changed by: Scarlette Shorts, MD   lisinopril-hydrochlorothiazide 20-25 MG tablet Commonly known as: ZESTORETIC Take 2 tablets by mouth daily.   metFORMIN 500 MG 24 hr tablet Commonly known as: GLUCOPHAGE-XR Take 4 tablets (2,000 mg total) by mouth daily.   metoprolol succinate 25 MG 24 hr tablet Commonly known as: TOPROL-XL Take 25 mg by mouth daily.   omeprazole 20 MG capsule Commonly known as: PRILOSEC Take 20 mg by mouth daily.   ondansetron 8 MG disintegrating tablet Commonly known as: ZOFRAN-ODT Take 1 tablet (8 mg total) by mouth every 8 (eight) hours as needed for nausea or vomiting.   polyethylene glycol 17 g packet Commonly known as: MiraLax Take 17 g by mouth 2 (two) times daily.   repaglinide 2 MG tablet Commonly known as: PRANDIN Take 1 tablet (2 mg total) by mouth 2 (two) times daily before a meal.   simvastatin 40 MG tablet Commonly known as: ZOCOR Take 40 mg by mouth at bedtime.   tadalafil 20 MG tablet Commonly known as: CIALIS 1 tablet         OBJECTIVE:   Vital Signs: BP 136/80 (BP Location: Left Arm, Patient Position: Sitting, Cuff Size: Large)   Pulse 70   Ht 6\' 1"  (1.854 m)   Wt 207 lb (93.9 kg)   SpO2 99%   BMI 27.31 kg/m   Wt Readings from Last 3 Encounters:  12/28/21 207 lb (93.9 kg)  10/10/21 190 lb (86.2 kg)  06/22/21 197 lb 3.2 oz (89.4 kg)     Exam: General: Pt appears well and is in NAD  Neck: General: Supple without adenopathy. Thyroid: Thyroid size normal.  No goiter or nodules appreciated.  Lungs: Clear with good BS bilat with no rales, rhonchi, or wheezes  Heart: RRR  Extremities: Trace pretibial edema.   Neuro: MS is good with appropriate affect, pt is alert and Ox3    DM foot exam: 06/22/2021  The skin of the feet shows a callus formation at the right 1st MT head The pedal pulses are 2+ on right and 2+ on left. The sensation  is decreased  to a screening 5.07, 10 gram monofilament bilaterally, worse on the right        DATA REVIEWED:  Lab Results  Component Value Date   HGBA1C 6.8 (A) 12/28/2021   HGBA1C 7.6 (A) 06/22/2021   HGBA1C 8.0 (A) 06/27/2020   Lab Results  Component Value Date   MICROALBUR 2.3 (H) 06/22/2021   LDLCALC 37 06/22/2021   CREATININE 0.80 10/10/2021    Latest Reference Range & Units 06/22/21 09:25  Sodium 135 - 145 mEq/L 136  Potassium 3.5 - 5.1 mEq/L 4.3  Chloride 96 - 112 mEq/L  101  CO2 19 - 32 mEq/L 26  Glucose 70 - 99 mg/dL 342 (H)  BUN 6 - 23 mg/dL 24 (H)  Creatinine 8.76 - 1.50 mg/dL 8.11  Calcium 8.4 - 57.2 mg/dL 9.8  Alkaline Phosphatase 39 - 117 U/L 53  Albumin 3.5 - 5.2 g/dL 4.2  AST 0 - 37 U/L 14  ALT 0 - 53 U/L 16  Total Protein 6.0 - 8.3 g/dL 6.5  Total Bilirubin 0.2 - 1.2 mg/dL 0.4  GFR >62.03 mL/min 72.67  Total CHOL/HDL Ratio  3  Cholesterol 0 - 200 mg/dL 559  HDL Cholesterol >74.16 mg/dL 38.45  LDL (calc) 0 - 99 mg/dL 37  MICROALB/CREAT RATIO 0.0 - 30.0 mg/g 8.5  NonHDL  68.62  Triglycerides 0.0 - 149.0 mg/dL 364.6 (H)  VLDL 0.0 - 80.3 mg/dL 21.2    Latest Reference Range & Units 06/22/21 09:25  Creatinine,U mg/dL 24.8  Microalb, Ur 0.0 - 1.9 mg/dL 2.3 (H)  MICROALB/CREAT RATIO 0.0 - 30.0 mg/g 8.5    In office BG 165 mg/dL   ASSESSMENT / PLAN / RECOMMENDATIONS:   1) Type 2 Diabetes Mellitus, Optimally controlled, With neuropathic complications - Most recent A1c of 6.8 %. Goal A1c < 7.0 %.    - His A1c has trended down but his fasting BG's continue to be above goal > 160 mg/dL and up  250'I mg/dL - Will increase repaglinide with supper as below  - Will reduce Welchol as below  -He is intolerant to pioglitazone -He is not a candidate for GLP-1 agonist nor DPP 4 inhibitors nor Mounjaro due to history of pancreatitis to Januvia   MEDICATIONS: Continue Jardiance 25 mg daily  Continue metformin 500 mg XR 4 tabs daily Increase  repaglinide 2  mg , 1 tab before Breakfast and 2 tabs before Supper Decrease  Welchol 325 mg, 2 tabs twice daily  EDUCATION / INSTRUCTIONS: BG monitoring instructions: Patient is instructed to check his blood sugars 1 times a day. Call [facility] if: BG persistently < 70  I reviewed the Rule of 15 for the treatment of hypoglycemia in detail with the patient. Literature supplied.   2) Diabetic complications:  Eye: Does not have known diabetic retinopathy.  Neuro/ Feet: Does  have known diabetic peripheral neuropathy .  Renal: Patient does not have known baseline CKD. He   is  on an ACEI/ARB at present.  's  3) Dyslipidemia:  -LDL at goal, TG slightly elevated we will continue to monitor  Medications Simvastatin 40 mg daily   F/U in 6 months   Signed electronically by: Lyndle Herrlich, MD  [facility] Endocrinology  [facility] [address] [place] [place], [place] [address] Phone: [phone] FAX: [phone]   CC: Farris Has, MD [address] [place] [address] Phone: [phone]  Fax: [phone]  Return to Endocrinology clinic as below: No future appointments.

## 2021-12-28 NOTE — Patient Instructions (Addendum)
-   Take Repaglinide 2 mg, 1 tablet before Brekafast and 2 tablets before Supper  - Continue Metformin 500 mg XR 4 tablets daily  - Continue Jardiance 25 mg daily   - Decrease Welchol 625 mg, 2 tablets twice daily      HOW TO TREAT LOW BLOOD SUGARS (Blood sugar LESS THAN 70 MG/DL) Please follow the RULE OF 15 for the treatment of hypoglycemia treatment (when your (blood sugars are less than 70 mg/dL)   STEP 1: Take 15 grams of carbohydrates when your blood sugar is low, which includes:  3-4 GLUCOSE TABS  OR 3-4 OZ OF JUICE OR REGULAR SODA OR ONE TUBE OF GLUCOSE GEL    STEP 2: RECHECK blood sugar in 15 MINUTES STEP 3: If your blood sugar is still low at the 15 minute recheck --> then, go back to STEP 1 and treat AGAIN with another 15 grams of carbohydrates.

## 2022-05-03 LAB — LAB REPORT - SCANNED
A1c: 8.7
EGFR (Non-African Amer.): 70

## 2022-06-28 ENCOUNTER — Encounter: Payer: Self-pay | Admitting: Internal Medicine

## 2022-06-28 ENCOUNTER — Ambulatory Visit (INDEPENDENT_AMBULATORY_CARE_PROVIDER_SITE_OTHER): Payer: Medicare Other | Admitting: Internal Medicine

## 2022-06-28 VITALS — BP 136/84 | HR 65 | Ht 73.0 in | Wt 202.0 lb

## 2022-06-28 DIAGNOSIS — Z7984 Long term (current) use of oral hypoglycemic drugs: Secondary | ICD-10-CM | POA: Diagnosis not present

## 2022-06-28 DIAGNOSIS — E1129 Type 2 diabetes mellitus with other diabetic kidney complication: Secondary | ICD-10-CM | POA: Diagnosis not present

## 2022-06-28 DIAGNOSIS — R809 Proteinuria, unspecified: Secondary | ICD-10-CM

## 2022-06-28 DIAGNOSIS — E1142 Type 2 diabetes mellitus with diabetic polyneuropathy: Secondary | ICD-10-CM

## 2022-06-28 LAB — POCT GLYCOSYLATED HEMOGLOBIN (HGB A1C): Hemoglobin A1C: 9.2 % — AB (ref 4.0–5.6)

## 2022-06-28 LAB — POCT GLUCOSE (DEVICE FOR HOME USE): POC Glucose: 413 mg/dl — AB (ref 70–99)

## 2022-06-28 MED ORDER — METFORMIN HCL ER 500 MG PO TB24: 2000.0000 mg | ORAL_TABLET | Freq: Every day | ORAL | 3 refills | Status: DC

## 2022-06-28 MED ORDER — GLIPIZIDE 5 MG PO TABS: 5.0000 mg | ORAL_TABLET | Freq: Every day | ORAL | 3 refills | Status: DC

## 2022-06-28 MED ORDER — REPAGLINIDE 2 MG PO TABS: 4.0000 mg | ORAL_TABLET | Freq: Two times a day (BID) | ORAL | 3 refills | Status: DC

## 2022-06-28 MED ORDER — EMPAGLIFLOZIN 25 MG PO TABS: 25.0000 mg | ORAL_TABLET | Freq: Every day | ORAL | 3 refills | Status: DC

## 2022-06-28 NOTE — Patient Instructions (Addendum)
-   Increase Repaglinide 2 mg, 2 tablet before Brekafast and 2 tablets before Supper  - STOP Welchol  - Start Glipizide 5 mg , 1 tablet before Breakfast  - Continue Metformin 500 mg XR 4 tablets daily  - Continue Jardiance 25 mg daily       HOW TO TREAT LOW BLOOD SUGARS (Blood sugar LESS THAN 70 MG/DL) Please follow the RULE OF 15 for the treatment of hypoglycemia treatment (when your (blood sugars are less than 70 mg/dL)   STEP 1: Take 15 grams of carbohydrates when your blood sugar is low, which includes:  3-4 GLUCOSE TABS  OR 3-4 OZ OF JUICE OR REGULAR SODA OR ONE TUBE OF GLUCOSE GEL    STEP 2: RECHECK blood sugar in 15 MINUTES STEP 3: If your blood sugar is still low at the 15 minute recheck --> then, go back to STEP 1 and treat AGAIN with another 15 grams of carbohydrates.

## 2022-06-28 NOTE — Progress Notes (Signed)
Name: Adam Cardenas  Age/ Sex: 72 y.o., male   MRN/ DOB: [id number], [birth year]     PCP: Farris Has, MD   Reason for Endocrinology Evaluation: Type 2 Diabetes Mellitus  Initial Endocrine Consultative Visit: 02/21/2017    PATIENT IDENTIFIER: Adam Cardenas is a 72 y.o. male with a past medical history of T2DM,Hx of pancreatitis (questionable relation to Januvia). The patient has followed with Endocrinology clinic since 02/21/2017 for consultative assistance with management of his diabetes.  DIABETIC HISTORY:  Adam Cardenas was diagnosed with DM 2005, he has history of pancreatitis that may have been attributed to Januvia, pioglitazone caused LE edema . His hemoglobin A1c has ranged from 6.4% in 2021, peaking at 9.6% in 2020.   SUBJECTIVE:   During the last visit (12/28/2021): A1c 6.8%      Today (06/28/2022): Adam Cardenas is here for follow-up on diabetes management.  He has not checked glucose lately.     He had labs done at his PCPs office 04/2022 as below, which were reviewed   He ate breakfast this am with bread and coffee and eggs  Denies nausea or vomiting  Denies diarrhea , he is prone to constipation  He has sensation loss in the feet     HOME DIABETES REGIMEN:  WelChol 625, 2 tabs twice daily Jardiance 25 mg daily Repaglinide 2 mg, 1 tab before breakfast and 2 tabs before supper Metformin 500 mg XR 4 tabs in the evenings     Statin: Yes ACE-I/ARB: Yes    METER DOWNLOAD SUMMARY: unable to download  166- 345 mg/dL   DIABETIC COMPLICATIONS: Microvascular complications:   Denies: CKD, neuropathy , retinopathy  Last Eye Exam: Completed 2021  Macrovascular complications:   Denies: CAD, CVA, PVD   HISTORY:  Past Medical History:  Past Medical History:  Diagnosis Date   Cellulitis    Diabetes mellitus    HTN (hypertension)    Hypercholesterolemia    Seasonal allergic rhinitis    Sleep apnea    Past Surgical History:  Past Surgical History:   Procedure Laterality Date   CATARACT EXTRACTION Bilateral    I & D EXTREMITY Left 11/06/2017   Procedure: IRRIGATION AND DEBRIDEMENT LEG;  Surgeon: Sheral Apley, MD;  Location: [facility] OR;  Service: Orthopedics;  Laterality: Left;   repair deviated septum     Social History:  reports that he quit smoking about 24 years ago. His smoking use included cigarettes. He has a 30.00 pack-year smoking history. He has never used smokeless tobacco. He reports current alcohol use. He reports that he does not use drugs. 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 If you have any questions, ask your nurse or doctor.          B-12 5000 MCG Caps Take 1 tablet by mouth daily at 6 (six) AM.   colesevelam 625 MG tablet Commonly known as: WELCHOL Take 2 tablets (1,250 mg total) by mouth 2 (two) times daily with a  meal.   empagliflozin 25 MG Tabs tablet Commonly known as: Jardiance Take 1 tablet (25 mg total) by mouth daily.   felodipine 5 MG 24 hr tablet Commonly known as: PLENDIL Take 5 mg by mouth daily.   ferrous sulfate 325 (65 FE) MG EC tablet Take 325 mg by mouth 3 (three) times daily with meals.   finasteride 5 MG tablet Commonly known as: PROSCAR Take 5 mg by mouth daily.   lisinopril-hydrochlorothiazide 20-25 MG tablet Commonly known as: ZESTORETIC Take 2 tablets by mouth daily.   metFORMIN 500 MG 24 hr tablet Commonly known as: GLUCOPHAGE-XR Take 4 tablets (2,000 mg total) by mouth daily.   metoprolol succinate 25 MG 24 hr tablet Commonly known as: TOPROL-XL Take 25 mg by mouth daily.   omeprazole 20 MG  capsule Commonly known as: PRILOSEC Take 20 mg by mouth daily.   ondansetron 8 MG disintegrating tablet Commonly known as: ZOFRAN-ODT Take 1 tablet (8 mg total) by mouth every 8 (eight) hours as needed for nausea or vomiting.   polyethylene glycol 17 g packet Commonly known as: MiraLax Take 17 g by mouth 2 (two) times daily.   repaglinide 2 MG tablet Commonly known as: PRANDIN Take 1 tablet (2 mg total) by mouth daily before breakfast AND 2 tablets (4 mg total) daily before supper.   simvastatin 40 MG tablet Commonly known as: ZOCOR Take 40 mg by mouth at bedtime.   tadalafil 20 MG tablet Commonly known as: CIALIS 1 tablet         OBJECTIVE:   Vital Signs: BP 136/84 (BP Location: Left Arm, Patient Position: Sitting, Cuff Size: Large)   Pulse 65   Ht 6\' 1"  (1.854 m)   Wt 202 lb (91.6 kg)   SpO2 96%   BMI 26.65 kg/m   Wt Readings from Last 3 Encounters:  06/28/22 202 lb (91.6 kg)  12/28/21 207 lb (93.9 kg)  10/10/21 190 lb (86.2 kg)     Exam: General: Pt appears well and is in NAD  Lungs: Clear with good BS bilat   Heart: RRR  Extremities: Trace pretibial edema.   Neuro: MS is good with appropriate affect, pt is alert and Ox3    DM foot exam: 06/28/2022  The skin of the feet shows a callus formation at the right 1st MT head The pedal pulses are 2+ on right and 2+ on left. The sensation is decreased  to a screening 5.07, 10 gram monofilament bilaterally, worse on the right        DATA REVIEWED:  Lab Results  Component Value Date   HGBA1C 6.8 (A) 12/28/2021   HGBA1C 7.6 (A) 06/22/2021   HGBA1C 8.0 (A) 06/27/2020    05/03/2022 A1c 8.7% BUN 36 CR 1.11 GFR 70   In office BG 413 mg/dL   ASSESSMENT / PLAN / RECOMMENDATIONS:   1) Type 2 Diabetes Mellitus, Poorly Controlled, With neuropathic complications - Most recent A1c of 9.2 %. Goal A1c < 7.0 %.    -Patient with worsening glycemic control, A1c has increased from 6.9% to 9.2% over the past 6  months -He was laid off work in the fall,2023,  patient assures me compliance with medication intake, I did ask the patient to buy a pillbox and confirm medication intake -I have encouraged the patient to check glucose regularly, his in office BG 413 mg/DL which is an hour and a half postprandial -I will stop WelChol and start him on glipizide - Will increase repaglinide as  below -He is intolerant to pioglitazone -He is not a candidate for GLP-1 agonist nor DPP 4 inhibitors nor Mounjaro due to history of pancreatitis to Januvia -We discussed the risk of hypoglycemia with combination of repaglinide and glipizide, patient advised to contact the office in case this happens, we reviewed the rule of 15 for correction.  I also advised the patient that he should take repaglinide and glipizide 15-20 minutes before meals  MEDICATIONS: Continue Jardiance 25 mg daily  Continue metformin 500 mg XR 4 tabs daily Increase  repaglinide 2 mg , 2 tabs before Breakfast and 2 tabs before Supper Stop Welchol  Start glipizide 5 mg, 1 tablet before breakfast  EDUCATION / INSTRUCTIONS: BG monitoring instructions: Patient is instructed to check his blood sugars 1 times a day. Call [facility] if: BG persistently < 70  I reviewed the Rule of 15 for the treatment of hypoglycemia in detail with the patient. Literature supplied.   2) Diabetic complications:  Eye: Does not have known diabetic retinopathy.  Neuro/ Feet: Does  have known diabetic peripheral neuropathy .  Renal: Patient does not have known baseline CKD. He   is  on an ACEI/ARB at present.   3) Dyslipidemia:  -LDL at goal     Medications Simvastatin 40 mg daily   F/U in 3 months   Signed electronically by: Lyndle Herrlich, MD  [facility] Endocrinology  [facility] [address] [place], [place] [address] Phone: [phone] FAX: [phone]   CC: Farris Has, MD [address] [place] [address] Phone: [phone]  Fax: [phone]  Return to Endocrinology clinic as below: Future Appointments  Date Time Provider Department Center  06/28/2022  7:50 AM Adam Cardenas, Adam Dolores, MD [id number] None

## 2022-10-06 NOTE — Progress Notes (Unsigned)
Name: Adam Cardenas  Age/ Sex: 72 y.o., male   MRN/ DOB: [id number], May 09, 1950     PCP: Farris Has, MD   Reason for Endocrinology Evaluation: Type 2 Diabetes Mellitus  Initial Endocrine Consultative Visit: 02/21/2017    PATIENT IDENTIFIER: Adam Cardenas is a 72 y.o. male with a past medical history of T2DM,Hx of pancreatitis (questionable relation to Januvia). The patient has followed with Endocrinology clinic since 02/21/2017 for consultative assistance with management of his diabetes.  DIABETIC HISTORY:  Adam Cardenas was diagnosed with DM 2005, he has history of pancreatitis that may have been attributed to Januvia, pioglitazone caused LE edema . His hemoglobin A1c has ranged from 6.4% in 2021, peaking at 9.6% in 2020.    SUBJECTIVE:   During the last visit (06/28/2022): A1c 9.2%      Today (10/07/2022): Adam Cardenas is here for follow-up on diabetes management.  He has not checked glucose lately.    Jardiance cost increase $140 to $300  Denies nausea or vomiting  Has alternating constipation with diarrhea      HOME DIABETES REGIMEN:  Jardiance 25 mg daily Repaglinide 2 mg, 2 tab before breakfast and 2 tabs before supper Metformin 500 mg XR 4 tabs in the evenings  Glipizide 5 mg daily    Statin: Yes ACE-I/ARB: Yes    METER DOWNLOAD SUMMARY: unable to download  143-212mg /dL   DIABETIC COMPLICATIONS: Microvascular complications:   Denies: CKD, neuropathy , retinopathy  Last Eye Exam: Completed 2021  Macrovascular complications:   Denies: CAD, CVA, PVD   HISTORY:  Past Medical History:  Past Medical History:  Diagnosis Date   Cellulitis    Diabetes mellitus    HTN (hypertension)    Hypercholesterolemia    Seasonal allergic rhinitis    Sleep apnea    Past Surgical History:  Past Surgical History:  Procedure Laterality Date   CATARACT EXTRACTION Bilateral    I & D EXTREMITY Left 11/06/2017   Procedure: IRRIGATION AND DEBRIDEMENT LEG;  Surgeon:  Sheral Apley, MD;  Location: [facility] OR;  Service: Orthopedics;  Laterality: Left;   repair deviated septum     Social History:  reports that he quit smoking about 24 years ago. His smoking use included cigarettes. He started smoking about 54 years ago. He has a 30 pack-year smoking history. He has never used smokeless tobacco. He reports current alcohol use. He reports that he does not use drugs. 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 If you have any questions, ask your nurse or doctor.          B-12 5000 MCG Caps Take 1 tablet by mouth daily at 6 (six) AM.   empagliflozin 25 MG Tabs tablet Commonly known as: Jardiance Take 1 tablet (25 mg total) by mouth daily.   felodipine 5 MG 24 hr tablet Commonly known as: PLENDIL Take 5 mg by mouth daily.   ferrous sulfate 325 (65 FE) MG EC tablet Take 325 mg by  mouth 3 (three) times daily with meals.   finasteride 5 MG tablet Commonly known as: PROSCAR Take 5 mg by mouth daily.   glipiZIDE 5 MG tablet Commonly known as: GLUCOTROL Take 1 tablet (5 mg total) by mouth daily before breakfast.   lisinopril-hydrochlorothiazide 20-25 MG tablet Commonly known as: ZESTORETIC Take 2 tablets by mouth daily.   metFORMIN 500 MG 24 hr tablet Commonly known as: GLUCOPHAGE-XR Take 4 tablets (2,000 mg total) by mouth daily.   metoprolol succinate 25 MG 24 hr tablet Commonly known as: TOPROL-XL Take 25 mg by mouth daily.   omeprazole 20 MG capsule Commonly known as: PRILOSEC Take 20 mg by mouth daily.   ondansetron 8 MG disintegrating tablet Commonly known as:  ZOFRAN-ODT Take 1 tablet (8 mg total) by mouth every 8 (eight) hours as needed for nausea or vomiting.   polyethylene glycol 17 g packet Commonly known as: MiraLax Take 17 g by mouth 2 (two) times daily.   repaglinide 2 MG tablet Commonly known as: PRANDIN Take 2 tablets (4 mg total) by mouth 2 (two) times daily before a meal.   simvastatin 40 MG tablet Commonly known as: ZOCOR Take 40 mg by mouth at bedtime.   tadalafil 20 MG tablet Commonly known as: CIALIS 1 tablet         OBJECTIVE:   Vital Signs: BP 132/76 (BP Location: Left Arm, Patient Position: Sitting, Cuff Size: Small)   Pulse 80   Ht 6\' 1"  (1.854 m)   Wt 196 lb (88.9 kg)   SpO2 99%   BMI 25.86 kg/m   Wt Readings from Last 3 Encounters:  10/07/22 196 lb (88.9 kg)  06/28/22 202 lb (91.6 kg)  12/28/21 207 lb (93.9 kg)     Exam: General: Pt appears well and is in NAD  Lungs: Clear with good BS bilat   Heart: RRR  Extremities: No pretibial edema.   Neuro: MS is good with appropriate affect, pt is alert and Ox3    DM foot exam: 10/07/2022  The skin of the feet shows a callus formation at the right 1st MT head The pedal pulses are 1+ on right and 1+ on left. The sensation is absent on the right and decreased on the left to a screening 5.07, 10 gram monofilament         DATA REVIEWED:  Lab Results  Component Value Date   HGBA1C 8.2 (A) 10/07/2022   HGBA1C 9.2 (A) 06/28/2022   HGBA1C 6.8 (A) 12/28/2021    05/03/2022 BUN 36 CR 1.11 GFR 70    ASSESSMENT / PLAN / RECOMMENDATIONS:   1) Type 2 Diabetes Mellitus, Poorly Controlled, With neuropathic complications - Most recent A1c of 8.2 %. Goal A1c < 7.0 %.    -A1c has improved from 9.2% to 8.2% -No evidence of hypoglycemia with combination of glipizide and repaglinide -He is intolerant to pioglitazone due to lower extremity edema -He is not a candidate for GLP-1 agonist nor DPP 4 inhibitors nor Mounjaro due to history of pancreatitis to  Januvia -Jardiance is cost prohibitive, it appears that he is in the coverage gap, patient will not qualify for assistance, #30 tabs were provided.  Patient may contact our office to check on samples as the price should improve by the beginning of next year -Will increase glipizide as below  MEDICATIONS: Continue Jardiance 25 mg daily  Continue metformin 500 mg XR 4 tabs daily Continue repaglinide 2 mg , 2 tabs before Breakfast and 2 tabs  before Supper Increase glipizide 5 mg, 1 tablet before breakfast 1 tablet before supper  EDUCATION / INSTRUCTIONS: BG monitoring instructions: Patient is instructed to check his blood sugars 1 times a day. Call [facility] if: BG persistently < 70  I reviewed the Rule of 15 for the treatment of hypoglycemia in detail with the patient. Literature supplied.   2) Diabetic complications:  Eye: Does not have known diabetic retinopathy.  Neuro/ Feet: Does  have known diabetic peripheral neuropathy .  Renal: Patient does not have known baseline CKD. He   is  on an ACEI/ARB at present.   3) Dyslipidemia:  -LDL at goal     Medications Simvastatin 40 mg daily   F/U in 4 months   Signed electronically by: Lyndle Herrlich, MD  [facility] Endocrinology  [facility] [address] [place], [place] [address] Phone: [phone] FAX: [phone]   CC: Farris Has, MD [address] [place] [address] Phone: [phone]  Fax: [phone]  Return to Endocrinology clinic as below: No future appointments.

## 2022-10-07 ENCOUNTER — Ambulatory Visit (INDEPENDENT_AMBULATORY_CARE_PROVIDER_SITE_OTHER): Payer: Medicare Other | Admitting: Internal Medicine

## 2022-10-07 ENCOUNTER — Encounter: Payer: Self-pay | Admitting: Internal Medicine

## 2022-10-07 VITALS — BP 132/76 | HR 80 | Ht 73.0 in | Wt 196.0 lb

## 2022-10-07 DIAGNOSIS — R809 Proteinuria, unspecified: Secondary | ICD-10-CM

## 2022-10-07 DIAGNOSIS — E1129 Type 2 diabetes mellitus with other diabetic kidney complication: Secondary | ICD-10-CM

## 2022-10-07 DIAGNOSIS — E1142 Type 2 diabetes mellitus with diabetic polyneuropathy: Secondary | ICD-10-CM

## 2022-10-07 DIAGNOSIS — Z7984 Long term (current) use of oral hypoglycemic drugs: Secondary | ICD-10-CM | POA: Diagnosis not present

## 2022-10-07 DIAGNOSIS — E1165 Type 2 diabetes mellitus with hyperglycemia: Secondary | ICD-10-CM

## 2022-10-07 LAB — POCT GLYCOSYLATED HEMOGLOBIN (HGB A1C): Hemoglobin A1C: 8.2 % — AB (ref 4.0–5.6)

## 2022-10-07 MED ORDER — METFORMIN HCL ER 500 MG PO TB24: 2000.0000 mg | ORAL_TABLET | Freq: Every day | ORAL | 3 refills | Status: DC

## 2022-10-07 MED ORDER — GLIPIZIDE 5 MG PO TABS: 5.0000 mg | ORAL_TABLET | Freq: Two times a day (BID) | ORAL | 3 refills | Status: DC

## 2022-10-07 MED ORDER — REPAGLINIDE 2 MG PO TABS: 4.0000 mg | ORAL_TABLET | Freq: Two times a day (BID) | ORAL | 3 refills | Status: DC

## 2022-10-07 MED ORDER — EMPAGLIFLOZIN 25 MG PO TABS: 25.0000 mg | ORAL_TABLET | Freq: Every day | ORAL | 3 refills | Status: DC

## 2022-10-07 NOTE — Patient Instructions (Addendum)
-   Continue Repaglinide 2 mg, 2 tablet before Brekafast and 2 tablets before Supper  - Increase  Glipizide 5 mg , 1 tablet before Breakfast and 1 tablet before Supper  - Continue Metformin 500 mg XR 4 tablets daily  - Continue Jardiance 25 mg daily       HOW TO TREAT LOW BLOOD SUGARS (Blood sugar LESS THAN 70 MG/DL) Please follow the RULE OF 15 for the treatment of hypoglycemia treatment (when your (blood sugars are less than 70 mg/dL)   STEP 1: Take 15 grams of carbohydrates when your blood sugar is low, which includes:  3-4 GLUCOSE TABS  OR 3-4 OZ OF JUICE OR REGULAR SODA OR ONE TUBE OF GLUCOSE GEL    STEP 2: RECHECK blood sugar in 15 MINUTES STEP 3: If your blood sugar is still low at the 15 minute recheck --> then, go back to STEP 1 and treat AGAIN with another 15 grams of carbohydrates.

## 2022-12-24 ENCOUNTER — Ambulatory Visit: Payer: Medicare Other | Admitting: Internal Medicine

## 2023-01-26 NOTE — Progress Notes (Signed)
Name: Adam Cardenas  Age/ Sex: 72 y.o., male   MRN/ DOB: [id number], [birth year]     PCP: Farris Has, MD   Reason for Endocrinology Evaluation: Type 2 Diabetes Mellitus  Initial Endocrine Consultative Visit: 02/21/2017    PATIENT IDENTIFIER: Adam Cardenas is a 72 y.o. male with a past medical history of T2DM,Hx of pancreatitis (questionable relation to Januvia). The patient has followed with Endocrinology clinic since 02/21/2017 for consultative assistance with management of his diabetes.  DIABETIC HISTORY:  Adam Cardenas was diagnosed with DM 2005, he has history of pancreatitis that may have been attributed to Januvia, pioglitazone caused LE edema . His hemoglobin A1c has ranged from 6.4% in 2021, peaking at 9.6% in 2020.    SUBJECTIVE:   During the last visit (10/07/2022): A1c 8.2%    Today (02/03/2023): Adam Cardenas is here for follow-up on diabetes management.  He checks glucose twice daily on occasions.  No hypoglycemia  Denies nausea or vomiting  Continues with alternating constipation with diarrhea - On miralax as needed    HOME DIABETES REGIMEN:  Jardiance 25 mg daily Repaglinide 2 mg, 2 tab before breakfast and 2 tabs before supper- 1 tab BID  Metformin 500 mg XR 4 tabs in the evenings  Glipizide 5 mg , 1 tab BID    Statin: Yes ACE-I/ARB: Yes    METER DOWNLOAD SUMMARY:  167-238 mg/dL    DIABETIC COMPLICATIONS: Microvascular complications:   Denies: CKD, neuropathy , retinopathy  Last Eye Exam: Completed 2021  Macrovascular complications:   Denies: CAD, CVA, PVD   HISTORY:  Past Medical History:  Past Medical History:  Diagnosis Date   Cellulitis    Diabetes mellitus    HTN (hypertension)    Hypercholesterolemia    Seasonal allergic rhinitis    Sleep apnea    Past Surgical History:  Past Surgical History:  Procedure Laterality Date   CATARACT EXTRACTION Bilateral    I & D EXTREMITY Left 11/06/2017   Procedure: IRRIGATION AND DEBRIDEMENT  LEG;  Surgeon: Sheral Apley, MD;  Location: [facility] OR;  Service: Orthopedics;  Laterality: Left;   repair deviated septum     Social History:  reports that he quit smoking about 25 years ago. His smoking use included cigarettes. He started smoking about 55 years ago. He has a 30 pack-year smoking history. He has never used smokeless tobacco. He reports current alcohol use. He reports that he does not use drugs. 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 If you have any questions, ask your nurse or doctor.          B-12 5000 MCG Caps Take 1 tablet by mouth daily at 6 (six) AM.   empagliflozin 25 MG Tabs tablet Commonly known as: Jardiance Take 1 tablet (25 mg total) by mouth daily.   felodipine 5 MG 24 hr tablet Commonly known as: PLENDIL Take 5 mg by mouth daily.   ferrous sulfate 325 (65 FE) MG EC tablet Take  325 mg by mouth 3 (three) times daily with meals.   finasteride 5 MG tablet Commonly known as: PROSCAR Take 5 mg by mouth daily.   glipiZIDE 10 MG tablet Commonly known as: GLUCOTROL Take 1 tablet (10 mg total) by mouth 2 (two) times daily before a meal. What changed:  medication strength how much to take Changed by: Johnney Ou Nohemy Koop   lisinopril-hydrochlorothiazide 20-25 MG tablet Commonly known as: ZESTORETIC Take 2 tablets by mouth daily.   metFORMIN 500 MG 24 hr tablet Commonly known as: GLUCOPHAGE-XR Take 4 tablets (2,000 mg total) by mouth daily.   metoprolol succinate 25 MG 24 hr tablet Commonly known as: TOPROL-XL Take 25 mg by mouth daily.   omeprazole 20 MG  capsule Commonly known as: PRILOSEC Take 20 mg by mouth daily.   ondansetron 8 MG disintegrating tablet Commonly known as: ZOFRAN-ODT Take 1 tablet (8 mg total) by mouth every 8 (eight) hours as needed for nausea or vomiting.   polyethylene glycol 17 g packet Commonly known as: MiraLax Take 17 g by mouth 2 (two) times daily.   repaglinide 2 MG tablet Commonly known as: PRANDIN Take 2 tablets (4 mg total) by mouth 2 (two) times daily before a meal.   simvastatin 40 MG tablet Commonly known as: ZOCOR Take 40 mg by mouth at bedtime.   tadalafil 20 MG tablet Commonly known as: CIALIS 1 tablet         OBJECTIVE:   Vital Signs: BP 132/70 (BP Location: Left Arm, Patient Position: Sitting, Cuff Size: Normal)   Pulse 83   Resp 20   Ht 6\' 1"  (1.854 m)   Wt 201 lb 9.6 oz (91.4 kg)   SpO2 96%   BMI 26.60 kg/m   Wt Readings from Last 3 Encounters:  02/03/23 201 lb 9.6 oz (91.4 kg)  10/07/22 196 lb (88.9 kg)  06/28/22 202 lb (91.6 kg)     Exam: General: Pt appears well and is in NAD  Lungs: Clear with good BS bilat   Heart: RRR  Extremities: No pretibial edema.   Neuro: MS is good with appropriate affect, pt is alert and Ox3    DM foot exam: 10/07/2022  The skin of the feet shows a callus formation at the right 1st MT head The pedal pulses are 1+ on right and 1+ on left. The sensation is absent on the right and decreased on the left to a screening 5.07, 10 gram monofilament         DATA REVIEWED:  Lab Results  Component Value Date   HGBA1C 8.3 (A) 02/03/2023   HGBA1C 8.2 (A) 10/07/2022   HGBA1C 9.2 (A) 06/28/2022    11/01/2022 BUN 30 CR 0.8 GFR 71 HDL 40 LDL 74 Tg 191 Ma/Cr ratio 16.1    ASSESSMENT / PLAN / RECOMMENDATIONS:   1) Type 2 Diabetes Mellitus, Poorly Controlled, With neuropathic complications - Most recent A1c of 8.3 %. Goal A1c < 7.0 %.    -A1c remains above goal -He has been taking repaglinide 1 tablet before breakfast and supper  rather than 2 tablets -In reviewing his meter download, patient has been noted with BG's consistently in the 200 range -I will increase glipizide as below but continue to take repaglinide 1 tablet twice daily -He is intolerant to pioglitazone due to lower extremity edema -He is not a candidate for GLP-1 agonist nor DPP 4 inhibitors nor Mounjaro due to history of pancreatitis to Januvia  MEDICATIONS: Continue Jardiance 25 mg  daily  Continue metformin 500 mg XR 4 tabs daily Continue repaglinide 2 mg , 1 tab before Breakfast and 1 tab before Supper Increase glipizide 10 mg, 1 tablet before breakfast 1 tablet before supper  EDUCATION / INSTRUCTIONS: BG monitoring instructions: Patient is instructed to check his blood sugars 1 times a day. Call [facility] if: BG persistently < 70  I reviewed the Rule of 15 for the treatment of hypoglycemia in detail with the patient. Literature supplied.   2) Diabetic complications:  Eye: Does not have known diabetic retinopathy.  Neuro/ Feet: Does  have known diabetic peripheral neuropathy .  Renal: Patient does not have known baseline CKD. He   is  on an ACEI/ARB at present.   3) Dyslipidemia:  -LDL at goal     Medications Simvastatin 40 mg daily   F/U in 4 months   Signed electronically by: Lyndle Herrlich, MD  [facility] Endocrinology  [facility] [address] [place], [place] [address] Phone: [phone] FAX: [phone]   CC: Farris Has, MD [address] [place] [address] Phone: [phone]  Fax: [phone]  Return to Endocrinology clinic as below: No future appointments.

## 2023-02-03 ENCOUNTER — Encounter: Payer: Self-pay | Admitting: Internal Medicine

## 2023-02-03 ENCOUNTER — Ambulatory Visit: Payer: Medicare Other | Admitting: Internal Medicine

## 2023-02-03 VITALS — BP 132/70 | HR 83 | Resp 20 | Ht 73.0 in | Wt 201.6 lb

## 2023-02-03 DIAGNOSIS — E785 Hyperlipidemia, unspecified: Secondary | ICD-10-CM | POA: Diagnosis not present

## 2023-02-03 DIAGNOSIS — E1142 Type 2 diabetes mellitus with diabetic polyneuropathy: Secondary | ICD-10-CM | POA: Diagnosis not present

## 2023-02-03 DIAGNOSIS — Z7984 Long term (current) use of oral hypoglycemic drugs: Secondary | ICD-10-CM

## 2023-02-03 DIAGNOSIS — E1165 Type 2 diabetes mellitus with hyperglycemia: Secondary | ICD-10-CM

## 2023-02-03 LAB — POCT GLYCOSYLATED HEMOGLOBIN (HGB A1C): Hemoglobin A1C: 8.3 % — AB (ref 4.0–5.6)

## 2023-02-03 MED ORDER — GLIPIZIDE 10 MG PO TABS
10.0000 mg | ORAL_TABLET | Freq: Two times a day (BID) | ORAL | 3 refills | Status: DC
Start: 2023-02-03 — End: 2023-03-10

## 2023-02-03 NOTE — Patient Instructions (Signed)
-   Continue Repaglinide 2 mg, 1 tablet before Brekafast and 1 tablet before Supper  - Increase  Glipizide 10  mg , 1 tablet before Breakfast and 1 tablet before Supper  - Continue Metformin 500 mg XR 4 tablets daily  - Continue Jardiance 25 mg daily       HOW TO TREAT LOW BLOOD SUGARS (Blood sugar LESS THAN 70 MG/DL) Please follow the RULE OF 15 for the treatment of hypoglycemia treatment (when your (blood sugars are less than 70 mg/dL)   STEP 1: Take 15 grams of carbohydrates when your blood sugar is low, which includes:  3-4 GLUCOSE TABS  OR 3-4 OZ OF JUICE OR REGULAR SODA OR ONE TUBE OF GLUCOSE GEL    STEP 2: RECHECK blood sugar in 15 MINUTES STEP 3: If your blood sugar is still low at the 15 minute recheck --> then, go back to STEP 1 and treat AGAIN with another 15 grams of carbohydrates.

## 2023-02-17 ENCOUNTER — Telehealth: Payer: Self-pay

## 2023-02-17 NOTE — Telephone Encounter (Signed)
 Patient notified

## 2023-02-17 NOTE — Telephone Encounter (Signed)
 Patient states he can't afford the Jardiance and would like to know if there is another option that would be less expensive.

## 2023-03-10 ENCOUNTER — Telehealth: Payer: Self-pay

## 2023-03-10 MED ORDER — GLIPIZIDE 10 MG PO TABS: 20.0000 mg | ORAL_TABLET | Freq: Two times a day (BID) | ORAL | 3 refills | Status: AC

## 2023-03-10 NOTE — Telephone Encounter (Signed)
Patient notified and will make the adjustments

## 2023-03-10 NOTE — Telephone Encounter (Signed)
Patient states that his blood sugars have been running in the 250 range fasting and evening. Would like to have the Glipizide increased as discuss.

## 2023-05-09 ENCOUNTER — Other Ambulatory Visit (HOSPITAL_COMMUNITY): Payer: Self-pay | Admitting: Family Medicine

## 2023-05-09 DIAGNOSIS — R011 Cardiac murmur, unspecified: Secondary | ICD-10-CM

## 2023-06-08 ENCOUNTER — Ambulatory Visit (HOSPITAL_COMMUNITY): Attending: Cardiovascular Disease

## 2023-06-08 DIAGNOSIS — R011 Cardiac murmur, unspecified: Secondary | ICD-10-CM | POA: Diagnosis present

## 2023-06-08 LAB — ECHOCARDIOGRAM COMPLETE
AR max vel: 1.45 cm2
AV Area VTI: 1.45 cm2
AV Area mean vel: 1.38 cm2
AV Mean grad: 8.8 mmHg
AV Peak grad: 15.8 mmHg
Ao pk vel: 1.99 m/s
Area-P 1/2: 4.27 cm2
S' Lateral: 3 cm

## 2023-06-15 ENCOUNTER — Ambulatory Visit (INDEPENDENT_AMBULATORY_CARE_PROVIDER_SITE_OTHER): Payer: Medicare Other | Admitting: Internal Medicine

## 2023-06-15 ENCOUNTER — Encounter: Payer: Self-pay | Admitting: Internal Medicine

## 2023-06-15 VITALS — BP 120/80 | HR 60 | Ht 73.0 in | Wt 193.0 lb

## 2023-06-15 DIAGNOSIS — E1142 Type 2 diabetes mellitus with diabetic polyneuropathy: Secondary | ICD-10-CM

## 2023-06-15 DIAGNOSIS — E1129 Type 2 diabetes mellitus with other diabetic kidney complication: Secondary | ICD-10-CM

## 2023-06-15 DIAGNOSIS — R809 Proteinuria, unspecified: Secondary | ICD-10-CM

## 2023-06-15 DIAGNOSIS — Z7984 Long term (current) use of oral hypoglycemic drugs: Secondary | ICD-10-CM | POA: Diagnosis not present

## 2023-06-15 DIAGNOSIS — E1165 Type 2 diabetes mellitus with hyperglycemia: Secondary | ICD-10-CM

## 2023-06-15 LAB — POCT GLYCOSYLATED HEMOGLOBIN (HGB A1C): Hemoglobin A1C: 7.9 % — AB (ref 4.0–5.6)

## 2023-06-15 MED ORDER — REPAGLINIDE 2 MG PO TABS: 6.0000 mg | ORAL_TABLET | Freq: Two times a day (BID) | ORAL | 3 refills | Status: DC

## 2023-06-15 NOTE — Patient Instructions (Addendum)
-   Increase Repaglinide  2 mg, 3 tablet before Brekafast and 3 tablet before Supper  - Continue   Glipizide  10  mg , 2 tablet before Breakfast and 2 tablet before Supper  - Continue Metformin  500 mg XR 4 tablets daily       HOW TO TREAT LOW BLOOD SUGARS (Blood sugar LESS THAN 70 MG/DL) Please follow the RULE OF 15 for the treatment of hypoglycemia treatment (when your (blood sugars are less than 70 mg/dL)   STEP 1: Take 15 grams of carbohydrates when your blood sugar is low, which includes:  3-4 GLUCOSE TABS  OR 3-4 OZ OF JUICE OR REGULAR SODA OR ONE TUBE OF GLUCOSE GEL    STEP 2: RECHECK blood sugar in 15 MINUTES STEP 3: If your blood sugar is still low at the 15 minute recheck --> then, go back to STEP 1 and treat AGAIN with another 15 grams of carbohydrates.

## 2023-06-15 NOTE — Progress Notes (Signed)
 Name: AMEY Cardenas  Age/ Sex: 73 y.o., male   MRN/ DOB: [id number], 07-May-1950     PCP: Ronna Coho, MD   Reason for Endocrinology Evaluation: Type 2 Diabetes Mellitus  Initial Endocrine Consultative Visit: 02/21/2017    PATIENT IDENTIFIER: Mr. Adam Cardenas is a 73 y.o. male with a past medical history of T2DM,Hx of pancreatitis (questionable relation to Januvia). The patient has followed with Endocrinology clinic since 02/21/2017 for consultative assistance with management of his diabetes.  DIABETIC HISTORY:  Adam Cardenas was diagnosed with DM 2005, he has history of pancreatitis that may have been attributed to Januvia, pioglitazone  caused LE edema . His hemoglobin A1c has ranged from 6.4% in 2021, peaking at 9.6% in 2020.  Discontinued Jardiance  2025 as it was cost prohibitive  SUBJECTIVE:   During the last visit (02/03/2023): A1c 8.3%    Today (06/15/2023): Adam Cardenas is here for follow-up on diabetes management.  He checks glucose twice daily .  No hypoglycemia   Patient follows with urology for BPH Denies nausea or vomiting  Continues with alternating constipation with diarrhea - On miralax  as needed    HOME DIABETES REGIMEN:  Repaglinide  2 mg, 2 tab before breakfast and 2 tabs before supper Metformin  500 mg XR 4 tabs in the evenings  Glipizide  10 mg , 2 tabs BID    Statin: Yes ACE-I/ARB: Yes   METER DOWNLOAD SUMMARY: 4/8-06/15/2023 Fingerstick Blood Glucose Tests = 49 Average Number Tests/Day = 1.6 Overall Mean FS Glucose = 195  BG Ranges: Low = 168 High = 257  BG Target % Results: % In target = 27 % Over target = 73 % Under target = 0  Hypoglycemic Events/30 Days: BG < 50 = 0 Episodes of symptomatic severe hypoglycemia = 0    DIABETIC COMPLICATIONS: Microvascular complications:   Denies: CKD, neuropathy , retinopathy  Last Eye Exam: Completed 2021  Macrovascular complications:   Denies: CAD, CVA, PVD   HISTORY:  Past Medical History:  Past  Medical History:  Diagnosis Date   Cellulitis    Diabetes mellitus    HTN (hypertension)    Hypercholesterolemia    Seasonal allergic rhinitis    Sleep apnea    Past Surgical History:  Past Surgical History:  Procedure Laterality Date   CATARACT EXTRACTION Bilateral    I & D EXTREMITY Left 11/06/2017   Procedure: IRRIGATION AND DEBRIDEMENT LEG;  Surgeon: Saundra Curl, MD;  Location: [facility] OR;  Service: Orthopedics;  Laterality: Left;   repair deviated septum     Social History:  reports that he quit smoking about 25 years ago. His smoking use included cigarettes. He started smoking about 55 years ago. He has a 30 pack-year smoking history. He has never used smokeless tobacco. He reports current alcohol use. He reports that he does not use drugs. 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 If you have any questions, ask your nurse or doctor.  B-12 5000 MCG Caps Take 1 tablet by mouth daily at 6 (six) Adam.   empagliflozin  25 MG Tabs tablet Commonly known as: Jardiance  Take 1 tablet (25 mg total) by mouth daily.   felodipine  5 MG 24 hr tablet Commonly known as: PLENDIL  Take 5 mg by mouth daily.   ferrous sulfate 325 (65 FE) MG EC tablet Take 325 mg by mouth 3 (three) times daily with meals.   finasteride  5 MG tablet Commonly known as: PROSCAR  Take 5 mg by mouth daily.   glipiZIDE  10 MG tablet Commonly known as: GLUCOTROL  Take 2 tablets (20 mg total) by mouth 2 (two) times daily before a meal.   lisinopril -hydrochlorothiazide  20-25 MG tablet Commonly known as: ZESTORETIC  Take 2 tablets by  mouth daily.   metFORMIN  500 MG 24 hr tablet Commonly known as: GLUCOPHAGE -XR Take 4 tablets (2,000 mg total) by mouth daily.   metoprolol succinate 25 MG 24 hr tablet Commonly known as: TOPROL-XL Take 25 mg by mouth daily.   omeprazole 20 MG capsule Commonly known as: PRILOSEC Take 20 mg by mouth daily.   ondansetron  8 MG disintegrating tablet Commonly known as: ZOFRAN -ODT Take 1 tablet (8 mg total) by mouth every 8 (eight) hours as needed for nausea or vomiting.   polyethylene glycol 17 g packet Commonly known as: MiraLax  Take 17 g by mouth 2 (two) times daily.   repaglinide  2 MG tablet Commonly known as: PRANDIN  Take 2 tablets (4 mg total) by mouth 2 (two) times daily before a meal.   simvastatin  40 MG tablet Commonly known as: ZOCOR  Take 40 mg by mouth at bedtime.   tadalafil 20 MG tablet Commonly known as: CIALIS 1 tablet         OBJECTIVE:   Vital Signs: BP 120/80 (BP Location: Left Arm, Patient Position: Sitting, Cuff Size: Normal)   Pulse 60   Ht 6\' 1"  (1.854 m)   Wt 193 lb (87.5 kg)   SpO2 98%   BMI 25.46 kg/m   Wt Readings from Last 3 Encounters:  06/15/23 193 lb (87.5 kg)  02/03/23 201 lb 9.6 oz (91.4 kg)  10/07/22 196 lb (88.9 kg)     Exam: General: Pt appears well and is in NAD  Lungs: Clear with good BS bilat   Heart: RRR  Extremities: No pretibial edema.   Neuro: MS is good with appropriate affect, pt is alert and Ox3    DM foot exam: 10/07/2022  The skin of the feet shows a callus formation at the right 1st MT head The pedal pulses are 1+ on right and 1+ on left. The sensation is absent on the right and decreased on the left to a screening 5.07, 10 gram monofilament         DATA REVIEWED:  Lab Results  Component Value Date   HGBA1C 7.9 (A) 06/15/2023   HGBA1C 8.3 (A) 02/03/2023   HGBA1C 8.2 (A) 10/07/2022    11/01/2022 BUN 30 CR 0.8 GFR 71 HDL 40 LDL 74 Tg 191 Ma/Cr ratio 16.1    ASSESSMENT / PLAN /  RECOMMENDATIONS:   1) Type 2 Diabetes Mellitus, Poorly Controlled, With neuropathic complications - Most recent A1c of 7.9 %. Goal A1c < 7.0 %.    -A1c is trending down but remains above goal -He is intolerant to pioglitazone  due to lower extremity edema -He is not a candidate for GLP-1 agonist nor DPP 4 inhibitors nor Adam Cardenas due to history of pancreatitis to Januvia - We entertain the idea  of insulin , patient is open to the idea of insulin  when needed - We did discuss increasing repaglinide  as below - He is already on maximum dose of metformin  and glipizide  - If his glycemic control does not improve on this regimen, we will have no other option but to switch to basal insulin   MEDICATIONS: Continue metformin  500 mg XR 4 tabs daily Continue repaglinide  2 mg , 3 tab before Breakfast and 3 tab before Supper Continue Glipizide  10 mg, 2 tablet before breakfast 2 tablet before supper  EDUCATION / INSTRUCTIONS: BG monitoring instructions: Patient is instructed to check his blood sugars 1 times a day. Call [place] [facility] if: BG persistently < 70  I reviewed the Rule of 15 for the treatment of hypoglycemia in detail with the patient. Literature supplied.   2) Diabetic complications:  Eye: Does not have known diabetic retinopathy.  Neuro/ Feet: Does  have known diabetic peripheral neuropathy .  Renal: Patient does not have known baseline CKD. He   is  on an ACEI/ARB at present.   3) Dyslipidemia:  -LDL at goal     Medications Simvastatin  40 mg daily   F/U in 3 months  I spent 25 minutes preparing to see the patient by review of recent labs, imaging and procedures, obtaining and reviewing separately obtained history, communicating with the patient, ordering medications, tests or procedures, and documenting clinical information in the EHR including the differential Dx, treatment, and any further evaluation and other management    Signed electronically by: Natale Bail, MD  [facility] Endocrinology  [facility] [address] [place] [place], [place] [address] Phone: [phone] FAX: [phone]   CC: Ronna Coho, MD [address] [place] [address] Phone: [phone]  Fax: [phone]  Return to Endocrinology clinic as below: Future Appointments  Date Time Provider Department Center  06/15/2023  8:30 Adam Xavier Munger, Julian Obey, MD [id number] None

## 2023-08-02 ENCOUNTER — Telehealth: Payer: Self-pay

## 2023-08-02 NOTE — Telephone Encounter (Signed)
 Can we get patient in next week on one of the dates Dr. Sam opened. Patient requesting to be seen sooner

## 2023-08-09 ENCOUNTER — Encounter: Payer: Self-pay | Admitting: Internal Medicine

## 2023-08-09 ENCOUNTER — Ambulatory Visit (INDEPENDENT_AMBULATORY_CARE_PROVIDER_SITE_OTHER): Admitting: Internal Medicine

## 2023-08-09 VITALS — BP 122/74 | HR 68 | Ht 73.0 in | Wt 197.0 lb

## 2023-08-09 DIAGNOSIS — Z794 Long term (current) use of insulin: Secondary | ICD-10-CM

## 2023-08-09 DIAGNOSIS — Z7984 Long term (current) use of oral hypoglycemic drugs: Secondary | ICD-10-CM

## 2023-08-09 DIAGNOSIS — E1165 Type 2 diabetes mellitus with hyperglycemia: Secondary | ICD-10-CM

## 2023-08-09 DIAGNOSIS — E1142 Type 2 diabetes mellitus with diabetic polyneuropathy: Secondary | ICD-10-CM

## 2023-08-09 MED ORDER — INSULIN PEN NEEDLE 32G X 4 MM MISC: 1.0000 | Freq: Every day | 3 refills | Status: AC

## 2023-08-09 MED ORDER — BASAGLAR KWIKPEN 100 UNIT/ML ~~LOC~~ SOPN: 14.0000 [IU] | PEN_INJECTOR | Freq: Every day | SUBCUTANEOUS | 3 refills | Status: DC

## 2023-08-09 NOTE — Progress Notes (Signed)
 Name: Adam Cardenas  Age/ Sex: 73 y.o., male   MRN/ DOB: [id number], January 17, 1951     PCP: Kip Righter, MD   Reason for Endocrinology Evaluation: Type 2 Diabetes Mellitus  Initial Endocrine Consultative Visit: 02/21/2017    PATIENT IDENTIFIER: Adam Cardenas is a 73 y.o. male with a past medical history of T2DM,Hx of pancreatitis (questionable relation to Januvia). The patient has followed with Endocrinology clinic since 02/21/2017 for consultative assistance with management of his diabetes.  DIABETIC HISTORY:  Adam Cardenas was diagnosed with DM 2005, he has history of pancreatitis that may have been attributed to Januvia, pioglitazone  caused LE edema . His hemoglobin A1c has ranged from 6.4% in 2021, peaking at 9.6% in 2020.  Discontinued Jardiance  2025 as it was cost prohibitive  SUBJECTIVE:   During the last visit (06/15/2023): A1c 7.9%    Today (08/09/2023): Adam Cardenas is here for follow-up on diabetes management.  He checks glucose twice daily .  No hypoglycemia   Denies nausea or vomiting  Continues with alternating constipation with diarrhea - continues on miralax  as needed    HOME DIABETES REGIMEN:  Repaglinide  2 mg, 3 tab before breakfast and 3 tabs before supper Metformin  500 mg XR 4 tabs in the evenings  Glipizide  10 mg , 2 tabs BID    Statin: Yes ACE-I/ARB: Yes   METER DOWNLOAD SUMMARY: 6/1-6/30/2025 Fingerstick Blood Glucose Tests = 20 Average Number Tests/Day = 0.7 Overall Mean FS Glucose = 255  BG Ranges: Low = 301 High = 223  BG Target % Results: % In target = 0 % Over target = 100 % Under target = 0  Hypoglycemic Events/30 Days: BG < 50 = 0 Episodes of symptomatic severe hypoglycemia = 0    DIABETIC COMPLICATIONS: Microvascular complications:   Denies: CKD, neuropathy , retinopathy  Last Eye Exam: Completed 2021  Macrovascular complications:   Denies: CAD, CVA, PVD   HISTORY:  Past Medical History:  Past Medical History:  Diagnosis  Date   Cellulitis    Diabetes mellitus    HTN (hypertension)    Hypercholesterolemia    Seasonal allergic rhinitis    Sleep apnea    Past Surgical History:  Past Surgical History:  Procedure Laterality Date   CATARACT EXTRACTION Bilateral    I & D EXTREMITY Left 11/06/2017   Procedure: IRRIGATION AND DEBRIDEMENT LEG;  Surgeon: Beverley Evalene BIRCH, MD;  Location: [facility] OR;  Service: Orthopedics;  Laterality: Left;   repair deviated septum     Social History:  reports that he quit smoking about 25 years ago. His smoking use included cigarettes. He started smoking about 55 years ago. He has a 30 pack-year smoking history. He has never used smokeless tobacco. He reports current alcohol use. He reports that he does not use drugs. 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 If you have any questions, ask your nurse or doctor.          B-12 5000 MCG Caps  Take 1 tablet by mouth daily at 6 (six) AM.   empagliflozin  25 MG Tabs tablet Commonly known as: Jardiance  Take 1 tablet (25 mg total) by mouth daily.   felodipine  5 MG 24 hr tablet Commonly known as: PLENDIL  Take 5 mg by mouth daily.   ferrous sulfate 325 (65 FE) MG EC tablet Take 325 mg by mouth 3 (three) times daily with meals.   finasteride  5 MG tablet Commonly known as: PROSCAR  Take 5 mg by mouth daily.   glipiZIDE  10 MG tablet Commonly known as: GLUCOTROL  Take 2 tablets (20 mg total) by mouth 2 (two) times daily before a meal.   lisinopril -hydrochlorothiazide  20-25 MG tablet Commonly known as: ZESTORETIC  Take 2 tablets by mouth daily.   metFORMIN  500  MG 24 hr tablet Commonly known as: GLUCOPHAGE -XR Take 4 tablets (2,000 mg total) by mouth daily.   metoprolol succinate 25 MG 24 hr tablet Commonly known as: TOPROL-XL Take 25 mg by mouth daily.   omeprazole 20 MG capsule Commonly known as: PRILOSEC Take 20 mg by mouth daily.   ondansetron  8 MG disintegrating tablet Commonly known as: ZOFRAN -ODT Take 1 tablet (8 mg total) by mouth every 8 (eight) hours as needed for nausea or vomiting.   polyethylene glycol 17 g packet Commonly known as: MiraLax  Take 17 g by mouth 2 (two) times daily.   repaglinide  2 MG tablet Commonly known as: PRANDIN  Take 3 tablets (6 mg total) by mouth 2 (two) times daily before a meal.   simvastatin  40 MG tablet Commonly known as: ZOCOR  Take 40 mg by mouth at bedtime.   tadalafil 20 MG tablet Commonly known as: CIALIS 1 tablet         OBJECTIVE:   Vital Signs: BP 122/74 (BP Location: Left Arm, Patient Position: Sitting, Cuff Size: Normal)   Pulse 68   Ht 6' 1 (1.854 m)   Wt 197 lb (89.4 kg)   SpO2 99%   BMI 25.99 kg/m   Wt Readings from Last 3 Encounters:  08/09/23 197 lb (89.4 kg)  06/15/23 193 lb (87.5 kg)  02/03/23 201 lb 9.6 oz (91.4 kg)     Exam: General: Pt appears well and is in NAD  Lungs: Clear with good BS bilat   Heart: RRR  Extremities: No pretibial edema.   Neuro: MS is good with appropriate affect, pt is alert and Ox3    DM foot exam: 08/09/2023  The skin of the feet shows a callus formation at the right 1st MT head The pedal pulses are 1+ on right and 1+ on left. The sensation is absent on the right and decreased on the left to a screening 5.07, 10 gram monofilament         DATA REVIEWED:  Lab Results  Component Value Date   HGBA1C 7.9 (A) 06/15/2023   HGBA1C 8.3 (A) 02/03/2023   HGBA1C 8.2 (A) 10/07/2022   05/04/2023  BUN 28 Cr. 0.800 GFR 82   11/01/2022 BUN 30 CR 0.8 GFR 71 HDL 40 LDL 74 Tg 191 Ma/Cr ratio 04.1    ASSESSMENT / PLAN /  RECOMMENDATIONS:   1) Type 2 Diabetes Mellitus, Poorly Controlled, With neuropathic complications - Most recent A1c of 7.9 %. Goal A1c < 7.0 %.    -Patient continues with persistent hyperglycemia -He is intolerant to pioglitazone  due to lower extremity edema -He is not a candidate for GLP-1 agonist nor DPP 4 inhibitors nor Mounjaro due to history of pancreatitis to Januvia - I have  recommended discontinue repaglinide  and starting basal insulin  -Caution against hypoglycemia and to contact our office with any low BG's - He was trained by my CMA on proper injection technique   MEDICATIONS: Continue metformin  500 mg XR 4 tabs daily Stop Repaglinide  2 mg , 3 tab before Breakfast and 3 tab before Supper Continue Glipizide  10 mg, 2 tablet before breakfast 2 tablet before supper Start Basaglar 14 units daily    EDUCATION / INSTRUCTIONS: BG monitoring instructions: Patient is instructed to check his blood sugars 1 times a day. Call [place] [facility] if: BG persistently < 70  I reviewed the Rule of 15 for the treatment of hypoglycemia in detail with the patient. Literature supplied.   2) Diabetic complications:  Eye: Does not have known diabetic retinopathy.  Neuro/ Feet: Does  have known diabetic peripheral neuropathy .  Renal: Patient does not have known baseline CKD. He   is  on an ACEI/ARB at present.   3) Dyslipidemia:  -LDL at goal     Medications Simvastatin  40 mg daily   F/U in 3 months     Signed electronically by: Stefano Redgie Butts, MD  [facility] Endocrinology  [facility] [address] [place], [place] [address] Phone: [phone] FAX: [phone]   CC: Kip Righter, MD [address] [place] [address] Phone: [phone]  Fax: [phone]  Return to Endocrinology clinic as below: No future appointments.

## 2023-08-09 NOTE — Patient Instructions (Signed)
-   STOP Repaglinide   - Start Basaglar (Insulin  ) 14 units once daily  - Continue   Glipizide  10  mg , 2 tablet before Breakfast and 2 tablet before Supper  - Continue Metformin  500 mg XR 4 tablets daily       HOW TO TREAT LOW BLOOD SUGARS (Blood sugar LESS THAN 70 MG/DL) Please follow the RULE OF 15 for the treatment of hypoglycemia treatment (when your (blood sugars are less than 70 mg/dL)   STEP 1: Take 15 grams of carbohydrates when your blood sugar is low, which includes:  3-4 GLUCOSE TABS  OR 3-4 OZ OF JUICE OR REGULAR SODA OR ONE TUBE OF GLUCOSE GEL    STEP 2: RECHECK blood sugar in 15 MINUTES STEP 3: If your blood sugar is still low at the 15 minute recheck --> then, go back to STEP 1 and treat AGAIN with another 15 grams of carbohydrates.

## 2023-09-15 ENCOUNTER — Encounter: Payer: Self-pay | Admitting: Internal Medicine

## 2023-09-15 ENCOUNTER — Ambulatory Visit: Admitting: Internal Medicine

## 2023-09-15 ENCOUNTER — Ambulatory Visit (INDEPENDENT_AMBULATORY_CARE_PROVIDER_SITE_OTHER): Admitting: Internal Medicine

## 2023-09-15 VITALS — BP 130/80 | HR 67 | Ht 73.0 in | Wt 200.2 lb

## 2023-09-15 DIAGNOSIS — E1142 Type 2 diabetes mellitus with diabetic polyneuropathy: Secondary | ICD-10-CM

## 2023-09-15 DIAGNOSIS — R809 Proteinuria, unspecified: Secondary | ICD-10-CM | POA: Diagnosis not present

## 2023-09-15 DIAGNOSIS — Z794 Long term (current) use of insulin: Secondary | ICD-10-CM

## 2023-09-15 DIAGNOSIS — E1129 Type 2 diabetes mellitus with other diabetic kidney complication: Secondary | ICD-10-CM | POA: Diagnosis not present

## 2023-09-15 DIAGNOSIS — Z7984 Long term (current) use of oral hypoglycemic drugs: Secondary | ICD-10-CM

## 2023-09-15 DIAGNOSIS — E1165 Type 2 diabetes mellitus with hyperglycemia: Secondary | ICD-10-CM

## 2023-09-15 LAB — POCT GLYCOSYLATED HEMOGLOBIN (HGB A1C): Hemoglobin A1C: 9.5 % — AB (ref 4.0–5.6)

## 2023-09-15 MED ORDER — BASAGLAR KWIKPEN 100 UNIT/ML ~~LOC~~ SOPN: 20.0000 [IU] | PEN_INJECTOR | Freq: Every day | SUBCUTANEOUS | 6 refills | Status: DC

## 2023-09-15 MED ORDER — DEXCOM G7 SENSOR MISC: 1.0000 | 3 refills | Status: AC

## 2023-09-15 NOTE — Patient Instructions (Addendum)
-   Increase Basaglar   20 units once daily  - Continue   Glipizide  10  mg , 2 tablet before Breakfast and 2 tablet before Supper  - Continue Metformin  500 mg XR 4 tablets daily       HOW TO TREAT LOW BLOOD SUGARS (Blood sugar LESS THAN 70 MG/DL) Please follow the RULE OF 15 for the treatment of hypoglycemia treatment (when your (blood sugars are less than 70 mg/dL)   STEP 1: Take 15 grams of carbohydrates when your blood sugar is low, which includes:  3-4 GLUCOSE TABS  OR 3-4 OZ OF JUICE OR REGULAR SODA OR ONE TUBE OF GLUCOSE GEL    STEP 2: RECHECK blood sugar in 15 MINUTES STEP 3: If your blood sugar is still low at the 15 minute recheck --> then, go back to STEP 1 and treat AGAIN with another 15 grams of carbohydrates.

## 2023-09-15 NOTE — Progress Notes (Signed)
 Name: Adam Cardenas  Age/ Sex: 73 y.o., male   MRN/ DOB: [id number], 1950/06/01     PCP: Kip Righter, MD   Reason for Endocrinology Evaluation: Type 2 Diabetes Mellitus  Initial Endocrine Consultative Visit: 02/21/2017    PATIENT IDENTIFIER: Adam Cardenas is a 73 y.o. male with a past medical history of T2DM,Hx of pancreatitis (questionable relation to Januvia). The patient has followed with Endocrinology clinic since 02/21/2017 for consultative assistance with management of his diabetes.  DIABETIC HISTORY:  Mr. Medlen was diagnosed with DM 2005, he has history of pancreatitis that may have been attributed to Januvia, pioglitazone  caused LE edema . His hemoglobin A1c has ranged from 6.4% in 2021, peaking at 9.6% in 2020.  Discontinued Jardiance  2025 as it was cost prohibitive  Discontinued repaglinide  and started basal insulin  July, 2025 with an A1c of 7.9%  SUBJECTIVE:   During the last visit (08/09/2023): A1c 7.9%    Today (09/15/2023): [name] is here for follow-up on diabetes management.  He checks glucose twice daily .  No hypoglycemia   Denies nausea or vomiting  Continues with alternating constipation with diarrhea - continues on miralax  as needed     HOME DIABETES REGIMEN:  Metformin  500 mg XR 4 tabs in the evenings  Glipizide  10 mg , 2 tabs BID Basaglar  14 units daily   Statin: Yes ACE-I/ARB: Yes    METER DOWNLOAD SUMMARY: 7/8-09/14/2023 Fingerstick Blood Glucose Tests = 56 Average Number Tests/Day = 1.9 Overall Mean FS Glucose = 218  BG Ranges: Low = 151 High = 293  BG Target % Results: % In target = 18 % Over target = 82 % Under target = 0  Hypoglycemic Events/30 Days: BG < 50 = 0 Episodes of symptomatic severe hypoglycemia = 0    DIABETIC COMPLICATIONS: Microvascular complications:   Denies: CKD, neuropathy , retinopathy  Last Eye Exam: Completed 2021  Macrovascular complications:   Denies: CAD, CVA, PVD   HISTORY:  Past Medical  History:  Past Medical History:  Diagnosis Date   Cellulitis    Diabetes mellitus    HTN (hypertension)    Hypercholesterolemia    Seasonal allergic rhinitis    Sleep apnea    Past Surgical History:  Past Surgical History:  Procedure Laterality Date   CATARACT EXTRACTION Bilateral    I & D EXTREMITY Left 11/06/2017   Procedure: IRRIGATION AND DEBRIDEMENT LEG;  Surgeon: Beverley Evalene BIRCH, MD;  Location: [facility] OR;  Service: Orthopedics;  Laterality: Left;   repair deviated septum     Social History:  reports that he quit smoking about 25 years ago. His smoking use included cigarettes. He started smoking about 55 years ago. He has a 30 pack-year smoking history. He has never used smokeless tobacco. He reports current alcohol use. He reports that he does not use drugs. Family History:  Family History  Problem Relation Age of Onset   Cancer Father    Diabetes Maternal Grandfather      HOME MEDICATIONS: Allergies as of 09/15/2023       Reactions   Sitagliptin Other (See Comments)   Pancreatitis        Medication List        Accurate as of September 15, 2023 10:19 AM. If you have any questions, ask your nurse or doctor.          B-12 5000 MCG Caps Take 1 tablet by mouth daily at 6 (six) AM.   Basaglar  KwikPen 100 UNIT/ML  Inject 14 Units into the skin daily.   felodipine  5 MG 24 hr tablet Commonly known as: PLENDIL  Take 5 mg by mouth daily.   ferrous sulfate 325 (65 FE) MG EC tablet Take 325 mg by mouth 3 (three) times daily with meals.   finasteride  5 MG tablet Commonly known as: PROSCAR  Take 5 mg by mouth daily.   glipiZIDE  10 MG tablet Commonly known as: GLUCOTROL  Take 2 tablets (20 mg total) by mouth 2 (two) times daily before a meal.   Insulin  Pen Needle 32G X 4 MM Misc 1 Device by Does not apply route daily in the afternoon.   lisinopril -hydrochlorothiazide  20-25 MG tablet Commonly known as: ZESTORETIC  Take 2 tablets by mouth daily.   metFORMIN  500 MG 24  hr tablet Commonly known as: GLUCOPHAGE -XR Take 4 tablets (2,000 mg total) by mouth daily.   metoprolol succinate 25 MG 24 hr tablet Commonly known as: TOPROL-XL Take 25 mg by mouth daily.   omeprazole 20 MG capsule Commonly known as: PRILOSEC Take 20 mg by mouth daily.   ondansetron  8 MG disintegrating tablet Commonly known as: ZOFRAN -ODT Take 1 tablet (8 mg total) by mouth every 8 (eight) hours as needed for nausea or vomiting.   polyethylene glycol 17 g packet Commonly known as: MiraLax  Take 17 g by mouth 2 (two) times daily. What changed:  when to take this reasons to take this   simvastatin  40 MG tablet Commonly known as: ZOCOR  Take 40 mg by mouth at bedtime.   tadalafil 20 MG tablet Commonly known as: CIALIS 1 tablet         OBJECTIVE:   Vital Signs: BP 130/80 (BP Location: Left Arm, Patient Position: Sitting, Cuff Size: Normal)   Pulse 67   Ht 6' 1 (1.854 m)   Wt 200 lb 3.2 oz (90.8 kg)   SpO2 98%   BMI 26.41 kg/m   Wt Readings from Last 3 Encounters:  09/15/23 200 lb 3.2 oz (90.8 kg)  08/09/23 197 lb (89.4 kg)  06/15/23 193 lb (87.5 kg)     Exam: General: Pt appears well and is in NAD  Lungs: Clear with good BS bilat   Heart: RRR  Extremities: No pretibial edema.   Neuro: MS is good with appropriate affect, pt is alert and Ox3    DM foot exam: 08/09/2023  The skin of the feet shows a callus formation at the right 1st MT head The pedal pulses are 1+ on right and 1+ on left. The sensation is absent on the right and decreased on the left to a screening 5.07, 10 gram monofilament         DATA REVIEWED:  Lab Results  Component Value Date   HGBA1C 9.5 (A) 09/15/2023   HGBA1C 7.9 (A) 06/15/2023   HGBA1C 8.3 (A) 02/03/2023   05/04/2023  BUN 28 Cr. 0.800 GFR 82    ASSESSMENT / PLAN / RECOMMENDATIONS:   1) Type 2 Diabetes Mellitus, Poorly Controlled, With neuropathic complications - Most recent A1c of 9.5 %. Goal A1c < 7.0 %.     -Patient continues with persistent hyperglycemia -He is intolerant to pioglitazone  due to lower extremity edema -He is not a candidate for GLP-1 agonist nor DPP 4 inhibitors nor Mounjaro due to history of pancreatitis to Januvia - Patient continues with hyperglycemia despite starting insulin  - Will increase insulin  as below - Patient was trained by CMA on Dexcom use, a prescription will be faxed to DME supplier - Patient encouraged to  contact our office should hyperglycemia persist after a week of using insulin   MEDICATIONS: Continue metformin  500 mg XR 4 tabs daily Continue Glipizide  10 mg, 2 tablet before breakfast 2 tablet before supper  Increase Basaglar  20 units daily    EDUCATION / INSTRUCTIONS: BG monitoring instructions: Patient is instructed to check his blood sugars 1 times a day. Call [facility] if: BG persistently < 70  I reviewed the Rule of 15 for the treatment of hypoglycemia in detail with the patient. Literature supplied.   2) Diabetic complications:  Eye: Does not have known diabetic retinopathy.  Neuro/ Feet: Does  have known diabetic peripheral neuropathy .  Renal: Patient does not have known baseline CKD. He   is  on an ACEI/ARB at present.   3) Dyslipidemia:  -LDL at goal    Medications Simvastatin  40 mg daily   F/U in 3 months     Signed electronically by: Stefano Redgie Butts, MD  [facility] Endocrinology  [facility] [address] [place], [place] [address] Phone: [phone] FAX: [phone]   CC: Kip Righter, MD [address] [place] [address] Phone: [phone]  Fax: [phone]  Return to Endocrinology clinic as below: No future appointments.

## 2023-10-27 ENCOUNTER — Other Ambulatory Visit: Payer: Self-pay | Admitting: Internal Medicine

## 2023-10-27 DIAGNOSIS — E1142 Type 2 diabetes mellitus with diabetic polyneuropathy: Secondary | ICD-10-CM

## 2023-10-27 DIAGNOSIS — E1129 Type 2 diabetes mellitus with other diabetic kidney complication: Secondary | ICD-10-CM

## 2023-10-28 ENCOUNTER — Telehealth: Payer: Self-pay

## 2023-10-28 NOTE — Telephone Encounter (Signed)
 Patient notified and will make the medication changes.

## 2023-10-28 NOTE — Telephone Encounter (Signed)
  Patient states that his BS have been running 150 fasting and after dinner 350.  Vision tends to get blurry when sugars are high  Basaglar  20 units daily Glipizide  2 tabs BID Metformin  4 tabs daily

## 2023-12-16 ENCOUNTER — Encounter: Payer: Self-pay | Admitting: Internal Medicine

## 2023-12-16 ENCOUNTER — Ambulatory Visit (INDEPENDENT_AMBULATORY_CARE_PROVIDER_SITE_OTHER): Admitting: Internal Medicine

## 2023-12-16 VITALS — BP 138/80 | HR 78 | Ht 73.0 in | Wt 203.0 lb

## 2023-12-16 DIAGNOSIS — Z794 Long term (current) use of insulin: Secondary | ICD-10-CM

## 2023-12-16 DIAGNOSIS — E1129 Type 2 diabetes mellitus with other diabetic kidney complication: Secondary | ICD-10-CM | POA: Diagnosis not present

## 2023-12-16 DIAGNOSIS — Z860100 Personal history of colon polyps, unspecified: Secondary | ICD-10-CM | POA: Insufficient documentation

## 2023-12-16 DIAGNOSIS — R809 Proteinuria, unspecified: Secondary | ICD-10-CM | POA: Diagnosis not present

## 2023-12-16 DIAGNOSIS — N400 Enlarged prostate without lower urinary tract symptoms: Secondary | ICD-10-CM | POA: Insufficient documentation

## 2023-12-16 DIAGNOSIS — I700 Atherosclerosis of aorta: Secondary | ICD-10-CM | POA: Insufficient documentation

## 2023-12-16 DIAGNOSIS — N182 Chronic kidney disease, stage 2 (mild): Secondary | ICD-10-CM | POA: Insufficient documentation

## 2023-12-16 DIAGNOSIS — E1142 Type 2 diabetes mellitus with diabetic polyneuropathy: Secondary | ICD-10-CM

## 2023-12-16 DIAGNOSIS — K5900 Constipation, unspecified: Secondary | ICD-10-CM | POA: Insufficient documentation

## 2023-12-16 DIAGNOSIS — R195 Other fecal abnormalities: Secondary | ICD-10-CM | POA: Insufficient documentation

## 2023-12-16 DIAGNOSIS — Z7984 Long term (current) use of oral hypoglycemic drugs: Secondary | ICD-10-CM

## 2023-12-16 DIAGNOSIS — R911 Solitary pulmonary nodule: Secondary | ICD-10-CM | POA: Insufficient documentation

## 2023-12-16 DIAGNOSIS — Z862 Personal history of diseases of the blood and blood-forming organs and certain disorders involving the immune mechanism: Secondary | ICD-10-CM | POA: Insufficient documentation

## 2023-12-16 DIAGNOSIS — E1121 Type 2 diabetes mellitus with diabetic nephropathy: Secondary | ICD-10-CM | POA: Insufficient documentation

## 2023-12-16 LAB — POCT GLYCOSYLATED HEMOGLOBIN (HGB A1C): Hemoglobin A1C: 6.8 % — AB (ref 4.0–5.6)

## 2023-12-16 MED ORDER — BASAGLAR KWIKPEN 100 UNIT/ML ~~LOC~~ SOPN: 22.0000 [IU] | PEN_INJECTOR | Freq: Every day | SUBCUTANEOUS | 3 refills | Status: DC

## 2023-12-16 NOTE — Patient Instructions (Addendum)
-   A1c down to 6.8% - Decrease Basaglar  22 units once daily  - Continue  Glipizide  10  mg , 2 tablet before Breakfast and 2 tablet before Supper  - Continue Metformin  500 mg XR 4 tablets daily       HOW TO TREAT LOW BLOOD SUGARS (Blood sugar LESS THAN 70 MG/DL) Please follow the RULE OF 15 for the treatment of hypoglycemia treatment (when your (blood sugars are less than 70 mg/dL)   STEP 1: Take 15 grams of carbohydrates when your blood sugar is low, which includes:  3-4 GLUCOSE TABS  OR 3-4 OZ OF JUICE OR REGULAR SODA OR ONE TUBE OF GLUCOSE GEL    STEP 2: RECHECK blood sugar in 15 MINUTES STEP 3: If your blood sugar is still low at the 15 minute recheck --> then, go back to STEP 1 and treat AGAIN with another 15 grams of carbohydrates.

## 2023-12-16 NOTE — Progress Notes (Signed)
 Name: Adam Cardenas  Age/ Sex: 73 y.o., male   MRN/ DOB: [id number], 03-14-1950     PCP: Kip Righter, MD   Reason for Endocrinology Evaluation: Type 2 Diabetes Mellitus  Initial Endocrine Consultative Visit: 02/21/2017    PATIENT IDENTIFIER: Mr. Adam Cardenas is a 73 y.o. male with a past medical history of T2DM,Hx of pancreatitis (questionable relation to Januvia). The patient has followed with Endocrinology clinic since 02/21/2017 for consultative assistance with management of his diabetes.  DIABETIC HISTORY:  [name] was diagnosed with DM 2005, he has history of pancreatitis that may have been attributed to Januvia, pioglitazone  caused LE edema . His hemoglobin A1c has ranged from 6.4% in 2021, peaking at 9.6% in 2020.  Discontinued Jardiance  2025 as it was cost prohibitive  Discontinued repaglinide  and started basal insulin  July, 2025 with an A1c of 7.9%  SUBJECTIVE:   During the last visit (09/15/2023): A1c 9.5%    Today (12/16/2023): Mr. [name] is here for follow-up on diabetes management. He checks his glucose multiple times daily through Dexcom, he also checks his glucose once daily through the Reli[employer] meter.  No hypoglycemia   Denies nausea or vomiting  Continues with use of Miralax  as needed     HOME DIABETES REGIMEN:  Metformin  500 mg XR 4 tabs in the evenings  Glipizide  10 mg , 2 tabs BID Basaglar  24 units daily   Statin: Yes ACE-I/ARB: Yes    METER DOWNLOAD SUMMARY:   90-day average 153 MGs/DL This a.m. 171 MGs/DL   Range 79 - 786  CONTINUOUS GLUCOSE MONITORING RECORD INTERPRETATION    Dates of Recording: 10/25 - 12/16/2023  Sensor description: Dexcom  Results statistics:   CGM use % of time 95  Average and SD 136/30  Time in range    89    %  % Time Above 180 9  % Time above 250 0  % Time Below target 2   Glycemic patterns summary: BGs are optimal throughout the day and night  Hyperglycemic episodes postprandial  Hypoglycemic episodes  occurred overnight  Overnight periods: Optimal    DIABETIC COMPLICATIONS: Microvascular complications:   Denies: CKD, neuropathy , retinopathy  Last Eye Exam: Completed 11/2023  Macrovascular complications:   Denies: CAD, CVA, PVD   HISTORY:  Past Medical History:  Past Medical History:  Diagnosis Date   Cellulitis    Diabetes mellitus    HTN (hypertension)    Hypercholesterolemia    Seasonal allergic rhinitis    Sleep apnea    Past Surgical History:  Past Surgical History:  Procedure Laterality Date   CATARACT EXTRACTION Bilateral    I & D EXTREMITY Left 11/06/2017   Procedure: IRRIGATION AND DEBRIDEMENT LEG;  Surgeon: Beverley Evalene BIRCH, MD;  Location: [facility] OR;  Service: Orthopedics;  Laterality: Left;   repair deviated septum     Social History:  reports that he quit smoking about 25 years ago. His smoking use included cigarettes. He started smoking about 55 years ago. He has a 30 pack-year smoking history. He has never used smokeless tobacco. He reports current alcohol use. He reports that he does not use drugs. Family History:  Family History  Problem Relation Age of Onset   Cancer Father    Diabetes Maternal Grandfather      HOME MEDICATIONS: Allergies as of 12/16/2023       Reactions   Sitagliptin Other (See Comments)   Pancreatitis        Medication List  Accurate as of December 16, 2023  8:21 AM. If you have any questions, ask your nurse or doctor.          B-12 5000 MCG Caps Take 1 tablet by mouth daily at 6 (six) AM.   Basaglar  KwikPen 100 UNIT/ML Inject 20 Units into the skin daily.   Dexcom G7 Sensor Misc 1 Device by Does not apply route as directed. Every 10 days   felodipine  5 MG 24 hr tablet Commonly known as: PLENDIL  Take 5 mg by mouth daily.   ferrous sulfate 325 (65 FE) MG EC tablet Take 325 mg by mouth 3 (three) times daily with meals.   finasteride  5 MG tablet Commonly known as: PROSCAR  Take 5 mg by mouth daily.    glipiZIDE  10 MG tablet Commonly known as: GLUCOTROL  Take 2 tablets (20 mg total) by mouth 2 (two) times daily before a meal.   Insulin  Pen Needle 32G X 4 MM Misc 1 Device by Does not apply route daily in the afternoon.   lisinopril -hydrochlorothiazide  20-25 MG tablet Commonly known as: ZESTORETIC  Take 2 tablets by mouth daily.   metFORMIN  500 MG 24 hr tablet Commonly known as: GLUCOPHAGE -XR TAKE 4 TABLETS (2,000 MG TOTAL) BY MOUTH DAILY   metoprolol succinate 25 MG 24 hr tablet Commonly known as: TOPROL-XL Take 25 mg by mouth daily.   omeprazole 20 MG capsule Commonly known as: PRILOSEC Take 20 mg by mouth daily.   ondansetron  8 MG disintegrating tablet Commonly known as: ZOFRAN -ODT Take 1 tablet (8 mg total) by mouth every 8 (eight) hours as needed for nausea or vomiting.   polyethylene glycol 17 g packet Commonly known as: MiraLax  Take 17 g by mouth 2 (two) times daily. What changed:  when to take this reasons to take this   simvastatin  40 MG tablet Commonly known as: ZOCOR  Take 40 mg by mouth at bedtime.   tadalafil 20 MG tablet Commonly known as: CIALIS 1 tablet         OBJECTIVE:   Vital Signs: BP 138/80   Pulse 78   Ht 6' 1 (1.854 m)   Wt 203 lb (92.1 kg)   SpO2 99%   BMI 26.78 kg/m   Wt Readings from Last 3 Encounters:  12/16/23 203 lb (92.1 kg)  09/15/23 200 lb 3.2 oz (90.8 kg)  08/09/23 197 lb (89.4 kg)     Exam: General: Pt appears well and is in NAD  Lungs: Clear with good BS bilat   Heart: RRR  Neuro: MS is good with appropriate affect, pt is alert and Ox3    DM foot exam: 08/09/2023  The skin of the feet shows a callus formation at the right 1st MT head The pedal pulses are 1+ on right and 1+ on left. The sensation is absent on the right and decreased on the left to a screening 5.07, 10 gram monofilament         DATA REVIEWED:  Lab Results  Component Value Date   HGBA1C 6.8 (A) 12/16/2023   HGBA1C 9.5 (A) 09/15/2023    HGBA1C 7.9 (A) 06/15/2023    11/14/2023 HDL 47 LDL 74 MA/CR ratio 744.3   05/04/2023 BUN 28 Cr. 0.800 GFR 82    ASSESSMENT / PLAN / RECOMMENDATIONS:   1) Type 2 Diabetes Mellitus, Optimally Controlled, With neuropathic complications - Most recent A1c of 6.8 %. Goal A1c < 7.0 %.    -A1c has trended down from 9.5% to 6.8% -He is intolerant to pioglitazone   due to lower extremity edema -He is not a candidate for GLP-1 agonist nor DPP 4 inhibitors nor Mounjaro due to history of pancreatitis to Januvia - Patient has been using the Dexcom, he has made dietary changes since being on the Dexcom, I encouraged him to continue with low-carb diet - I will decrease basal insulin  due to hypoglycemia overnight  MEDICATIONS: Continue metformin  500 mg XR 4 tabs daily Continue Glipizide  10 mg, 2 tablet before breakfast 2 tablet before supper Decrease Basaglar  22 units daily   EDUCATION / INSTRUCTIONS: BG monitoring instructions: Patient is instructed to check his blood sugars 1 times a day. Call [facility] if: BG persistently < 70  I reviewed the Rule of 15 for the treatment of hypoglycemia in detail with the patient. Literature supplied.   2) Diabetic complications:  Eye: Does not have known diabetic retinopathy.  Neuro/ Feet: Does  have known diabetic peripheral neuropathy .  Renal: Patient does not have known baseline CKD. He   is  on an ACEI/ARB at present.   3) Dyslipidemia:  -LDL at goal through PCPs office   Medications Continue simvastatin  40 mg daily   F/U in 6 months    In addition to time spent performing glucose monitor, I spent 25 minutes preparing to see the patient by review of recent labs, imaging and procedures, obtaining and reviewing separately obtained history, communicating with the patient/family or caregiver, ordering medications, tests or procedures, and documenting clinical information in the EHR including the differential Dx, treatment,  and any further evaluation and other management    Signed electronically by: Stefano Redgie Butts, MD  [facility] Endocrinology  [facility] [address] [place] [place], [place] [address] Phone: [phone] FAX: [phone]   CC: Kip Righter, MD [address] [place] [address] Phone: [phone]  Fax: [phone]  Return to Endocrinology clinic as below: No future appointments.

## 2023-12-26 ENCOUNTER — Telehealth: Payer: Self-pay

## 2023-12-26 ENCOUNTER — Other Ambulatory Visit: Payer: Self-pay | Admitting: Internal Medicine

## 2023-12-26 MED ORDER — INSULIN GLARGINE 100 UNIT/ML SOLOSTAR PEN: 22.0000 [IU] | PEN_INJECTOR | Freq: Every day | SUBCUTANEOUS | 11 refills | Status: DC

## 2023-12-26 NOTE — Telephone Encounter (Signed)
 Patient is calling to say that his insurance will no longer cover basaglar .  He would like to be switched to glargine solostar which he says is on his list of covered medications.

## 2023-12-26 NOTE — Telephone Encounter (Signed)
 Done

## 2023-12-28 NOTE — Telephone Encounter (Signed)
Patient aware of the change

## 2023-12-30 ENCOUNTER — Telehealth: Payer: Self-pay

## 2023-12-30 NOTE — Telephone Encounter (Signed)
 Patient is concerned that his new insurance will need a different insulin .  Advised patient we would adjust insulin  to insurance needs after the first of the year.   Patient agreeable.

## 2024-02-27 ENCOUNTER — Telehealth: Payer: Self-pay | Admitting: Internal Medicine

## 2024-02-27 MED ORDER — INSULIN GLARGINE 100 UNITS/ML SOLOSTAR PEN: 22.0000 [IU] | PEN_INJECTOR | Freq: Every day | SUBCUTANEOUS | 3 refills | Status: AC

## 2024-02-27 NOTE — Telephone Encounter (Signed)
 Please change to glargine solostar per last note from patient.

## 2024-02-27 NOTE — Addendum Note (Signed)
 Addended by: SAM STEFANO PARAS on: 02/27/2024 11:02 AM   Modules accepted: Orders

## 2024-02-27 NOTE — Telephone Encounter (Signed)
 Glargine Solostar sent to the pharmacy

## 2024-02-27 NOTE — Telephone Encounter (Signed)
 Patient is calling to say that he has new insurance for 2026, [facility], and they will not cover Basaglar .  Patient states that he has 3 days worth of Basaglar  left.  Patient still uses   [id number] [address], [place] - [address] AT [facility] [address] (Ph: [phone])

## 2024-06-14 ENCOUNTER — Ambulatory Visit: Admitting: Internal Medicine
